# Patient Record
Sex: Female | Born: 1960 | Race: White | Hispanic: No | Marital: Married | Smoking: Current every day smoker
Health system: Southern US, Community
[De-identification: ages and names within clinical notes are randomized; demographics above are authoritative.]

## PROBLEM LIST (undated history)

## (undated) DIAGNOSIS — E119 Type 2 diabetes mellitus without complications: Secondary | ICD-10-CM

## (undated) DIAGNOSIS — I10 Essential (primary) hypertension: Secondary | ICD-10-CM

## (undated) DIAGNOSIS — C801 Malignant (primary) neoplasm, unspecified: Secondary | ICD-10-CM

## (undated) HISTORY — DX: Type 2 diabetes mellitus without complications: E11.9

## (undated) HISTORY — DX: Essential (primary) hypertension: I10

## (undated) HISTORY — PX: TONSILLECTOMY: SUR1361

## (undated) HISTORY — DX: Malignant (primary) neoplasm, unspecified: C80.1

## (undated) HISTORY — PX: TUBAL LIGATION: SHX77

---

## 2002-09-05 ENCOUNTER — Ambulatory Visit (HOSPITAL_BASED_OUTPATIENT_CLINIC_OR_DEPARTMENT_OTHER): Admission: RE | Admit: 2002-09-05 | Discharge: 2002-09-05 | Payer: Self-pay | Admitting: *Deleted

## 2004-05-10 HISTORY — PX: OTHER SURGICAL HISTORY: SHX169

## 2005-05-28 ENCOUNTER — Ambulatory Visit (HOSPITAL_COMMUNITY): Admission: RE | Admit: 2005-05-28 | Discharge: 2005-05-28 | Payer: Self-pay | Admitting: Family Medicine

## 2009-01-17 ENCOUNTER — Ambulatory Visit (HOSPITAL_COMMUNITY): Admission: RE | Admit: 2009-01-17 | Discharge: 2009-01-17 | Payer: Self-pay | Admitting: Family Medicine

## 2010-09-25 NOTE — Op Note (Signed)
NAME:  Leslie May, Leslie May                     ACCOUNT NO.:  [id number]   MEDICAL RECORD NO.:  [id number]                   PATIENT TYPE:  AMB   LOCATION:  DSC                                  FACILITY:  [facility]   PHYSICIAN:  Lowell Bouton, M.D.      DATE OF BIRTH:  07-15-1960   DATE OF PROCEDURE:  09/05/2002  DATE OF DISCHARGE:                                 OPERATIVE REPORT   PREOPERATIVE DIAGNOSIS:  Ulnar nerve compression, left elbow.   POSTOPERATIVE DIAGNOSIS:  Ulnar nerve compression, left elbow.   PROCEDURE:  Anterior transposition subcutaneously of ulnar nerve, left  elbow.   SURGEON:  Shirlee Latch, M.D.   ANESTHESIA:  General.   OPERATIVE FINDINGS:  The patient had a small muscle that extended from the  medial epicondyle posteriorly.  The proximal end of the nerve appeared to be  free and at the entrance to the FCU and there was not significant pressure.  The branch to the FCU was intact.   DESCRIPTION OF PROCEDURE:  Under general anesthesia with a tourniquet on the  left arm, the left arm was prepped and draped in usual fashion.  After  exsanguinating the limb, the tourniquet was inflated to 250 mmHg.  A curved  incision was made following the ulnar nerve at the elbow extending  proximally and distally to the medial epicondyle.  Sharp dissection was  carried through the subcutaneous tissues and bleeding points were  coagulated.  Weitlaner retractors were inserted for retraction.  Blunt  dissection was carried down to the ulnar nerve in the groove and it was  found just proximal to the medial epicondyle.  It was then traced proximally  and distally, and there was a band of muscle with fascia overlying the nerve  just at the medial epicondyle that went posteriorly.  This was divided off  of the medial epicondyle.  This appeared to be the site of compression.  The  nerve was then traced more distally down just past the branch to the FCU  taking care  to protect it.  It was traced proximally about 8.0 cm above the  medial epicondyle.  Fascial bands were released throughout.  The nerve was  freed up circumferentially and bleeding was controlled with electrocautery.  A knife was used to elevate the subcutaneous fat from the muscle of the  flexor origin and the nerve was transposed anteriorly into the subcutaneous  fat.  The fat was then closed back down to the medial epicondyle with a 4-0  Vicryl suture allowing adequate room for the nerve to glide anteriorly.  The  elbow was placed through a range of motion and it appeared not to have any  kinking of the nerve.  Four sutures were inserted in that area and then the  wound was irrigated with saline and the subcutaneous tissue was closed with  4-0 Vicryl over a vessel loop drain.  The skin was closed with a 3-0  subcuticular Prolene.  Steri-Strips were applied, followed by  sterile dressings, and a posterior splint.  The tourniquet was released with  good circulation of the hand.  0.5% Marcaine was placed in the subcutaneous  tissues for pain control.  The patient went to the recovery room awake,  stable and in good condition.                                               Lowell Bouton, M.D.    EMM/MEDQ  D:  09/05/2002  T:  09/05/2002  Job:  [id number]   cc:   Patrica Duel, M.D.  [address]  [place]  [place] [address]  Fax: [phone]

## 2011-08-10 ENCOUNTER — Ambulatory Visit (INDEPENDENT_AMBULATORY_CARE_PROVIDER_SITE_OTHER): Payer: PRIVATE HEALTH INSURANCE | Admitting: Family Medicine

## 2011-08-10 ENCOUNTER — Encounter: Payer: Self-pay | Admitting: Family Medicine

## 2011-08-10 DIAGNOSIS — N950 Postmenopausal bleeding: Secondary | ICD-10-CM | POA: Insufficient documentation

## 2011-08-10 DIAGNOSIS — Z1321 Encounter for screening for nutritional disorder: Secondary | ICD-10-CM

## 2011-08-10 DIAGNOSIS — E669 Obesity, unspecified: Secondary | ICD-10-CM

## 2011-08-10 DIAGNOSIS — Z13220 Encounter for screening for lipoid disorders: Secondary | ICD-10-CM

## 2011-08-10 DIAGNOSIS — I10 Essential (primary) hypertension: Secondary | ICD-10-CM

## 2011-08-10 DIAGNOSIS — F17200 Nicotine dependence, unspecified, uncomplicated: Secondary | ICD-10-CM

## 2011-08-10 DIAGNOSIS — Z720 Tobacco use: Secondary | ICD-10-CM

## 2011-08-10 DIAGNOSIS — N938 Other specified abnormal uterine and vaginal bleeding: Secondary | ICD-10-CM

## 2011-08-10 DIAGNOSIS — N939 Abnormal uterine and vaginal bleeding, unspecified: Secondary | ICD-10-CM

## 2011-08-10 DIAGNOSIS — N926 Irregular menstruation, unspecified: Secondary | ICD-10-CM

## 2011-08-10 DIAGNOSIS — Z130 Encounter for screening for diseases of the blood and blood-forming organs and certain disorders involving the immune mechanism: Secondary | ICD-10-CM

## 2011-08-10 MED ORDER — OLMESARTAN MEDOXOMIL 20 MG PO TABS: ORAL_TABLET | ORAL | Status: DC

## 2011-08-10 MED ORDER — OMEPRAZOLE 20 MG PO CPDR: 20.0000 mg | DELAYED_RELEASE_CAPSULE | Freq: Every day | ORAL | Status: DC

## 2011-08-10 MED ORDER — VERAPAMIL HCL ER 300 MG PO CP24: 1.0000 | ORAL_CAPSULE | Freq: Every day | ORAL | Status: DC

## 2011-08-10 NOTE — Progress Notes (Signed)
  Subjective:    Patient ID: Leslie May, female    DOB: January 01, 1961, 51 y.o.   MRN: [id number]  HPI  Pt here to establish care, previous PCP [facility] Medications and history reviewed   HTN- out of BP meds x 1 day, she had not seen her PCP in some time and is due for labs  Menstrual bleeding- menses stopped at age 5, for 2 years no cycle, then recently had heavy bleeding x 5 days, this has now resolved. Overdue for PAP Smear, has history of cervical cancer and has had cyrosurgery in the past Takes [place] MVI to help with her hot flashes  Due for Mammogram, due for colonoscopy  Obesity- using weight watchers, has lot 32lbs since January, she wants to lose 40 more pounds      Review of Systems  GEN- denies fatigue, fever, weight loss,weakness, recent illness HEENT- denies eye drainage, change in vision, nasal discharge, CVS- denies chest pain, palpitations RESP- denies SOB, cough, wheeze ABD- denies N/V, change in stools, abd pain GU- denies dysuria, hematuria, dribbling, incontinence MSK- denies joint pain, muscle aches, injury Neuro- denies headache, dizziness, syncope, seizure activity      Objective:   Physical Exam GEN- NAD, alert and oriented x3, obese HEENT- PERRL, EOMI, non injected sclera, pink conjunctiva, MMM, oropharynx clear Neck- Supple, no thyromegaly CVS- RRR, no murmur RESP-CTAB EXT- No edema Pulses- Radial, DP- 2+        Assessment & Plan:

## 2011-08-10 NOTE — Assessment & Plan Note (Signed)
Blood pressure medications refilled. 

## 2011-08-10 NOTE — Assessment & Plan Note (Signed)
Pt not ready to quit, wants to wait until she is done with weight watchers, continue to encourage cessation

## 2011-08-10 NOTE — Assessment & Plan Note (Signed)
Obtain labs, pt to schedule PAP Smear, will also need ultrasound set up to evaluate endometrium

## 2011-08-10 NOTE — Patient Instructions (Signed)
Get the bloodwork done fasting- do not eat after midnight Blood pressure medication refilled Keep up the good work on your [employer] watchers Schedule a physical exam for your PAP Smear within the next 6-8 weeks

## 2011-08-10 NOTE — Assessment & Plan Note (Signed)
Continue weight watchers, pt has had very good sucess

## 2011-09-17 ENCOUNTER — Ambulatory Visit (INDEPENDENT_AMBULATORY_CARE_PROVIDER_SITE_OTHER): Payer: PRIVATE HEALTH INSURANCE | Admitting: Family Medicine

## 2011-09-17 ENCOUNTER — Other Ambulatory Visit (HOSPITAL_COMMUNITY)
Admission: RE | Admit: 2011-09-17 | Discharge: 2011-09-17 | Disposition: A | Discharge status: Home / Self Care | Payer: PRIVATE HEALTH INSURANCE | Source: Ambulatory Visit | Attending: Family Medicine | Admitting: Family Medicine

## 2011-09-17 ENCOUNTER — Telehealth: Payer: Self-pay | Admitting: Family Medicine

## 2011-09-17 ENCOUNTER — Encounter: Payer: Self-pay | Admitting: Family Medicine

## 2011-09-17 VITALS — BP 124/72 | HR 94 | Resp 18 | Ht 65.75 in | Wt 195.0 lb

## 2011-09-17 DIAGNOSIS — N938 Other specified abnormal uterine and vaginal bleeding: Secondary | ICD-10-CM

## 2011-09-17 DIAGNOSIS — I10 Essential (primary) hypertension: Secondary | ICD-10-CM

## 2011-09-17 DIAGNOSIS — Z01419 Encounter for gynecological examination (general) (routine) without abnormal findings: Secondary | ICD-10-CM | POA: Insufficient documentation

## 2011-09-17 DIAGNOSIS — D219 Benign neoplasm of connective and other soft tissue, unspecified: Secondary | ICD-10-CM

## 2011-09-17 DIAGNOSIS — Z720 Tobacco use: Secondary | ICD-10-CM

## 2011-09-17 DIAGNOSIS — F17200 Nicotine dependence, unspecified, uncomplicated: Secondary | ICD-10-CM

## 2011-09-17 DIAGNOSIS — D259 Leiomyoma of uterus, unspecified: Secondary | ICD-10-CM

## 2011-09-17 DIAGNOSIS — E669 Obesity, unspecified: Secondary | ICD-10-CM

## 2011-09-17 DIAGNOSIS — N926 Irregular menstruation, unspecified: Secondary | ICD-10-CM

## 2011-09-17 LAB — LIPID PANEL
Cholesterol: 162 mg/dL (ref 0–200)
HDL: 28 mg/dL — ABNORMAL LOW (ref >39)
VLDL: 29 mg/dL (ref 0–40)

## 2011-09-17 LAB — POC HEMOCCULT BLD/STL (OFFICE/1-CARD/DIAGNOSTIC): Fecal Occult Blood, POC: NEGATIVE

## 2011-09-17 LAB — COMPREHENSIVE METABOLIC PANEL
AST: 15 U/L (ref 0–37)
Chloride: 104 mEq/L (ref 96–112)
Potassium: 5.4 mEq/L — ABNORMAL HIGH (ref 3.5–5.3)
Total Bilirubin: 0.4 mg/dL (ref 0.3–1.2)

## 2011-09-17 NOTE — Patient Instructions (Signed)
We will send a letter with your PAP Smear results We will call with lab results next week Continue your current medications Ultrasound will be set up for the bleeding If you want a colonoscopy or Mammogram scheduled please call I recommend eye visit yearly  I recommend dental visit every 6 months Congratulations on the weight loss F/U 4 months for blood pressure

## 2011-09-17 NOTE — Telephone Encounter (Signed)
Patient is aware 

## 2011-09-18 LAB — CBC WITH DIFFERENTIAL/PLATELET
Basophils Relative: 0 % (ref 0–1)
Eosinophils Absolute: 0.1 10*3/uL (ref 0.0–0.7)
Eosinophils Relative: 1 % (ref 0–5)
Hemoglobin: 14.8 g/dL (ref 12.0–15.0)
MCHC: 32.2 g/dL (ref 30.0–36.0)
MCV: 89 fL (ref 78.0–100.0)
Monocytes Relative: 6 % (ref 3–12)
Neutro Abs: 6.1 10*3/uL (ref 1.7–7.7)
Neutrophils Relative %: 58 % (ref 43–77)
RDW: 14.1 % (ref 11.5–15.5)
WBC: 10.6 10*3/uL — ABNORMAL HIGH (ref 4.0–10.5)

## 2011-09-18 LAB — FOLLICLE STIMULATING HORMONE: FSH: 38.7 m[IU]/mL

## 2011-09-19 ENCOUNTER — Encounter: Payer: Self-pay | Admitting: Family Medicine

## 2011-09-19 NOTE — Progress Notes (Signed)
  Subjective:    Patient ID: Leslie May, female    DOB: 08-30-1960, 51 y.o.   MRN: [id number]  HPI Pt here for GYN exam Medications reviewed, doing well on BP medications, She had another episode of vaginal bleeding, LMP 2 years ago. Mild cramping, small clots noted. Declines Mammogram, Declines Colonoscopy Labs done today Review of Systems GEN- denies fatigue, fever, weight loss,weakness, recent illness HEENT- denies eye drainage, change in vision, nasal discharge, CVS- denies chest pain, palpitations RESP- denies SOB, cough, wheeze ABD- denies N/V, change in stools, abd pain GU- denies dysuria, hematuria, dribbling, incontinence MSK- denies joint pain, muscle aches, injury Neuro- denies headache, dizziness, syncope, seizure activity       Objective:   Physical Exam GEN- NAD, alert and oriented x3 Neck- supple, no thyromegaly CVS-RRR,no murmur RESP-CTAB Breast- normal symmetry, no nipple inversion,no nipple drainage, no nodules or lumps felt Nodes- no axillary nodes GU- normal external genitalia, vaginal mucosa pink and moist, cervix visualized no growth, no blood form os,no discharge, no CMT, no ovarian masses, uterus normal size Ext-no edema Pulse- Radial 2+ Rectal-normal tone, FOBT Neg      Assessment & Plan:

## 2011-09-19 NOTE — Assessment & Plan Note (Signed)
This is concerning as she has not had a menses in greater than 2 years. PAP obtained, labs pending, will obtain ultrasound. Discussed need for GYN referral

## 2011-09-19 NOTE — Assessment & Plan Note (Signed)
Continued weight loss with Weight watchers

## 2011-09-19 NOTE — Assessment & Plan Note (Signed)
Continue to encourage cessation, pt concentrating of weight loss at this time

## 2011-09-19 NOTE — Assessment & Plan Note (Signed)
At goal with current meds 

## 2011-09-19 NOTE — Assessment & Plan Note (Signed)
PAP smear done, declines preventative screening for breast and colon

## 2011-09-21 ENCOUNTER — Ambulatory Visit (HOSPITAL_COMMUNITY)
Admission: RE | Admit: 2011-09-21 | Discharge: 2011-09-21 | Disposition: A | Discharge status: Home / Self Care | Payer: PRIVATE HEALTH INSURANCE | Source: Ambulatory Visit | Attending: Family Medicine | Admitting: Family Medicine

## 2011-09-21 ENCOUNTER — Other Ambulatory Visit (HOSPITAL_COMMUNITY): Payer: PRIVATE HEALTH INSURANCE

## 2011-09-21 ENCOUNTER — Other Ambulatory Visit: Payer: Self-pay | Admitting: Family Medicine

## 2011-09-21 DIAGNOSIS — N938 Other specified abnormal uterine and vaginal bleeding: Secondary | ICD-10-CM

## 2011-09-21 DIAGNOSIS — R9389 Abnormal findings on diagnostic imaging of other specified body structures: Secondary | ICD-10-CM | POA: Insufficient documentation

## 2011-09-21 DIAGNOSIS — N950 Postmenopausal bleeding: Secondary | ICD-10-CM | POA: Insufficient documentation

## 2011-09-21 DIAGNOSIS — N72 Inflammatory disease of cervix uteri: Secondary | ICD-10-CM | POA: Insufficient documentation

## 2011-09-22 ENCOUNTER — Telehealth: Payer: Self-pay | Admitting: Family Medicine

## 2011-09-22 NOTE — Telephone Encounter (Signed)
LVM, please give results Hormones in menapause state, ultrasound shows fibroids these could be causing her bleeding, I have referred her to a female provider in [place], we will call back with appt time Cholesterol looks good with exeception of low HDL, pt working on diet and exercise with weight watches

## 2011-09-22 NOTE — Progress Notes (Signed)
Addended by: Milinda Antis F on: 09/22/2011 08:23 AM   Modules accepted: Orders

## 2011-09-22 NOTE — Telephone Encounter (Signed)
Pt aware and requested info be faxed to her.

## 2011-11-30 ENCOUNTER — Telehealth: Payer: Self-pay | Admitting: Family Medicine

## 2011-12-01 NOTE — Telephone Encounter (Signed)
She said she is not going to do that right now

## 2011-12-01 NOTE — Telephone Encounter (Signed)
Please let her know, she needs to see GYN for this. I have referred her to [facility] before for her bleeding

## 2011-12-02 NOTE — Telephone Encounter (Signed)
Noted, I will not prescribe hormone therapy.

## 2012-01-21 ENCOUNTER — Encounter: Payer: Self-pay | Admitting: Family Medicine

## 2012-01-21 ENCOUNTER — Ambulatory Visit (INDEPENDENT_AMBULATORY_CARE_PROVIDER_SITE_OTHER): Payer: PRIVATE HEALTH INSURANCE | Admitting: Family Medicine

## 2012-01-21 VITALS — BP 120/76 | HR 68 | Resp 18 | Ht 65.75 in | Wt 179.0 lb

## 2012-01-21 DIAGNOSIS — N950 Postmenopausal bleeding: Secondary | ICD-10-CM

## 2012-01-21 DIAGNOSIS — I10 Essential (primary) hypertension: Secondary | ICD-10-CM

## 2012-01-21 DIAGNOSIS — F4321 Adjustment disorder with depressed mood: Secondary | ICD-10-CM

## 2012-01-21 DIAGNOSIS — F4320 Adjustment disorder, unspecified: Secondary | ICD-10-CM

## 2012-01-21 DIAGNOSIS — E669 Obesity, unspecified: Secondary | ICD-10-CM

## 2012-01-21 MED ORDER — OLMESARTAN MEDOXOMIL 20 MG PO TABS: ORAL_TABLET | ORAL | Status: DC

## 2012-01-21 MED ORDER — VERAPAMIL HCL ER 300 MG PO CP24: 1.0000 | ORAL_CAPSULE | Freq: Every day | ORAL | Status: DC

## 2012-01-21 MED ORDER — OMEPRAZOLE 20 MG PO CPDR: 20.0000 mg | DELAYED_RELEASE_CAPSULE | Freq: Every day | ORAL | Status: DC

## 2012-01-21 MED ORDER — CLONAZEPAM 0.5 MG PO TABS: 0.5000 mg | ORAL_TABLET | Freq: Two times a day (BID) | ORAL | Status: DC | PRN

## 2012-01-21 NOTE — Patient Instructions (Signed)
Use the nerve pill as needed Call if you want to do FMLA Continue all other medications When you are ready for GYN  F/U 4 Months

## 2012-01-23 ENCOUNTER — Encounter: Payer: Self-pay | Admitting: Family Medicine

## 2012-01-23 DIAGNOSIS — F4321 Adjustment disorder with depressed mood: Secondary | ICD-10-CM | POA: Insufficient documentation

## 2012-01-23 NOTE — Assessment & Plan Note (Signed)
Recent passing of father, advised her that her many symptoms are normal during this period and worse because of stress with her sister and lack of sleep. She has fear of medication but agreed to low dose benzo.

## 2012-01-23 NOTE — Assessment & Plan Note (Signed)
Well controlled 

## 2012-01-23 NOTE — Progress Notes (Signed)
  Subjective:    Patient ID: Leslie May, female    DOB: 08/18/1960, 51 y.o.   MRN: [id number]  HPI Pt presents for chronic medical follow-up. No difficulties with blood pressure medication. Her father died 1 week ago and she is having a feud with her sister over the estate and now it has become legal. She is not sleeping, and feels her hot flashes are worse, she is very anxious and can not relax. She has not taken off work because of fear of putting too much on others. She did not go to GYN appt for bleeding. Asking for hormone pills   Review of Systems  GEN- denies fatigue, fever, weight loss,weakness, recent illness HEENT- denies eye drainage, change in vision, nasal discharge, CVS- denies chest pain, palpitations RESP- denies SOB, cough, wheeze ABD- denies N/V, change in stools, abd pain GU- denies dysuria, hematuria, dribbling, incontinence MSK- denies joint pain, muscle aches, injury Neuro- denies headache, dizziness, syncope, seizure activity      Objective:   Physical Exam GEN- NAD, alert and oriented x3 HEENT- PERRL, EOMI, non injected sclera, pink conjunctiva, MMM, oropharynx clear CVS- RRR, no murmur RESP-CTAB EXT- No edema Pulses- Radial, DP- 2+ Psych- crying during exam, not overly anxious, +depressed affect, no SI       Assessment & Plan:

## 2012-01-23 NOTE — Assessment & Plan Note (Signed)
No further bleeding, advised her symptoms of menopause worse in setting of grief. She would need to be evaluated by GYN for hormone therapy and she wishes to wait, advised too many risk with her bleeding not being evaluated yet

## 2012-01-23 NOTE — Assessment & Plan Note (Signed)
Continues to loose weight with weight watches

## 2012-01-24 ENCOUNTER — Telehealth: Payer: Self-pay | Admitting: Family Medicine

## 2012-01-24 MED ORDER — DIPHENOXYLATE-ATROPINE 2.5-0.025 MG PO TABS: 1.0000 | ORAL_TABLET | Freq: Four times a day (QID) | ORAL | Status: DC | PRN

## 2012-01-24 NOTE — Telephone Encounter (Signed)
Pt came by and was triaged by nurse, she failed to mention she has had some diarrhea on and off for past 2 weeks, tried immodium which is not helping, seen on Friday, benign exam Will give trial of lomotil

## 2012-01-27 ENCOUNTER — Telehealth: Payer: Self-pay | Admitting: Family Medicine

## 2012-01-27 NOTE — Telephone Encounter (Signed)
FMLA papers at the front

## 2012-01-28 NOTE — Telephone Encounter (Signed)
Patient has picked up the paperwork

## 2012-02-04 ENCOUNTER — Telehealth: Payer: Self-pay | Admitting: Family Medicine

## 2012-02-04 NOTE — Telephone Encounter (Signed)
Pt can in, discussed she may not have qualified for disability based on being out for grievance and symptoms related, but she was concerned she would only get 2 weeks of FMLA and this should not be the case. I called [employer] during the visit with Mrs. Behunin but message was left, will try back latera [phone]/ [phone]

## 2012-02-17 ENCOUNTER — Ambulatory Visit (INDEPENDENT_AMBULATORY_CARE_PROVIDER_SITE_OTHER): Payer: PRIVATE HEALTH INSURANCE | Admitting: Family Medicine

## 2012-02-17 ENCOUNTER — Encounter: Payer: Self-pay | Admitting: Family Medicine

## 2012-02-17 VITALS — BP 106/62 | HR 86 | Resp 18 | Ht 65.75 in | Wt 180.0 lb

## 2012-02-17 DIAGNOSIS — F4320 Adjustment disorder, unspecified: Secondary | ICD-10-CM

## 2012-02-17 DIAGNOSIS — F4321 Adjustment disorder with depressed mood: Secondary | ICD-10-CM

## 2012-02-17 DIAGNOSIS — I10 Essential (primary) hypertension: Secondary | ICD-10-CM

## 2012-02-17 DIAGNOSIS — N950 Postmenopausal bleeding: Secondary | ICD-10-CM

## 2012-02-17 MED ORDER — OMEPRAZOLE 20 MG PO CPDR: 20.0000 mg | DELAYED_RELEASE_CAPSULE | Freq: Every day | ORAL | Status: DC

## 2012-02-17 NOTE — Patient Instructions (Signed)
Continue current medications  Released back to Work F/U 4 months

## 2012-02-17 NOTE — Assessment & Plan Note (Signed)
She's taken some time for her family and degrees. She is ready to return to work. She will be released back to work full-time with no restrictions

## 2012-02-17 NOTE — Progress Notes (Signed)
  Subjective:    Patient ID: Leslie May, female    DOB: February 20, 1961, 51 y.o.   MRN: [id number]  HPI Patient presents to followup previous FMLA for grievance from her fathers recent death. She's been doing much better. She rarely uses the Klonopin. She is ready to return to work. Her son is separated from his wife and this is causing her some stress but overall she feels better. She's been seen by GYN for the dysfunctional uterine bleeding and plans to have endometrial biopsy tomorrow as well as mammogram. She was started on CombiPatch hormone therapy for the hot flashes.   Review of Systems  GEN- denies fatigue, fever, weight loss,weakness, recent illness HEENT- denies eye drainage, change in vision, nasal discharge, CVS- denies chest pain, palpitations RESP- denies SOB, cough, wheeze ABD- denies N/V, change in stools, abd pain GU- denies dysuria, hematuria, dribbling, incontinence, +hot flashes MSK- denies joint pain, muscle aches, injury Neuro- denies headache, dizziness, syncope, seizure activity      Objective:   Physical Exam GEN- NAD, alert and oriented x3 HEENT- PERRL, EOMI, non injected sclera, pink conjunctiva, MMM, oropharynx clear Neck- Supple,  CVS- RRR, no murmur RESP-CTAB EXT- No edema Pulses- Radial, DP- 2+ Psych-normal affect and Mood         Assessment & Plan:

## 2012-02-17 NOTE — Assessment & Plan Note (Signed)
Well controlled 

## 2012-02-17 NOTE — Assessment & Plan Note (Signed)
She has been seen by GYN and in the process of getting work-up, advised her hormone therapy will take time to work, she was under the impression this was not helping however just started combipatch

## 2012-02-18 ENCOUNTER — Other Ambulatory Visit: Payer: Self-pay | Admitting: Obstetrics and Gynecology

## 2012-02-18 ENCOUNTER — Ambulatory Visit: Payer: PRIVATE HEALTH INSURANCE | Admitting: Family Medicine

## 2012-06-19 ENCOUNTER — Ambulatory Visit: Payer: PRIVATE HEALTH INSURANCE | Admitting: Family Medicine

## 2012-07-06 ENCOUNTER — Ambulatory Visit (INDEPENDENT_AMBULATORY_CARE_PROVIDER_SITE_OTHER): Payer: BC Managed Care – PPO | Admitting: Family Medicine

## 2012-07-06 ENCOUNTER — Encounter: Payer: Self-pay | Admitting: Family Medicine

## 2012-07-06 VITALS — BP 122/74 | HR 68 | Resp 18 | Ht 65.75 in | Wt 182.1 lb

## 2012-07-06 DIAGNOSIS — E669 Obesity, unspecified: Secondary | ICD-10-CM

## 2012-07-06 DIAGNOSIS — I10 Essential (primary) hypertension: Secondary | ICD-10-CM

## 2012-07-06 DIAGNOSIS — Z720 Tobacco use: Secondary | ICD-10-CM

## 2012-07-06 DIAGNOSIS — F17200 Nicotine dependence, unspecified, uncomplicated: Secondary | ICD-10-CM

## 2012-07-06 NOTE — Progress Notes (Signed)
  Subjective:    Patient ID: Leslie May, female    DOB: Dec 17, 1960, 52 y.o.   MRN: [id number]  HPI  Patient here to followup high blood pressure. She has no specific concerns she is doing well. She did gain some weight but is back on her exercise routine as well as Weight Watchers. She does continue to smoke.  Review of Systems   GEN- denies fatigue, fever, weight loss,weakness, recent illness HEENT- denies eye drainage, change in vision, nasal discharge, CVS- denies chest pain, palpitations RESP- denies SOB, cough, wheeze ABD- denies N/V, change in stools, abd pain GU- denies dysuria, hematuria, dribbling, incontinence MSK- denies joint pain, muscle aches, injury Neuro- denies headache, dizziness, syncope, seizure activity      Objective:   Physical Exam GEN- NAD, alert and oriented x3 HEENT- PERRL, EOMI, non injected sclera, pink conjunctiva, MMM, oropharynx clear CVS- RRR, no murmur RESP-CTAB EXT- No edema Pulses- Radial, DP- 2+        Assessment & Plan:

## 2012-07-06 NOTE — Patient Instructions (Signed)
Stop the benicar  Continue verapamil You can give yourself a trial off the prilosec  Get the labs done 1 week before next visit F/U Mid May- for blood pressure

## 2012-07-07 NOTE — Assessment & Plan Note (Signed)
Unchanged, counseled on cessation 

## 2012-07-07 NOTE — Assessment & Plan Note (Signed)
Her blood pressure has been very well controlled.I will stop her Benicar and leave her on verapamil to make sure she maintains her blood pressure and she has been losing weight

## 2012-07-07 NOTE — Assessment & Plan Note (Signed)
Continues on weight loss plan

## 2012-08-10 ENCOUNTER — Ambulatory Visit (INDEPENDENT_AMBULATORY_CARE_PROVIDER_SITE_OTHER): Payer: BC Managed Care – PPO | Admitting: Family Medicine

## 2012-08-10 ENCOUNTER — Encounter: Payer: Self-pay | Admitting: Family Medicine

## 2012-08-10 VITALS — BP 124/80 | HR 77 | Temp 98.3°F | Resp 16 | Ht 65.75 in | Wt 188.0 lb

## 2012-08-10 DIAGNOSIS — J069 Acute upper respiratory infection, unspecified: Secondary | ICD-10-CM

## 2012-08-10 NOTE — Patient Instructions (Addendum)
Sudafed as directed for 2 days  Robitussin for cough Mucinex  Plenty of fluids Call if you do not improve  Upper Respiratory Infection, Adult An upper respiratory infection (URI) is also sometimes known as the common cold. The upper respiratory tract includes the nose, sinuses, throat, trachea, and bronchi. Bronchi are the airways leading to the lungs. Most people improve within 1 week, but symptoms can last up to 2 weeks. A residual cough may last even longer.  CAUSES Many different viruses can infect the tissues lining the upper respiratory tract. The tissues become irritated and inflamed and often become very moist. Mucus production is also common. A cold is contagious. You can easily spread the virus to others by oral contact. This includes kissing, sharing a glass, coughing, or sneezing. Touching your mouth or nose and then touching a surface, which is then touched by another person, can also spread the virus. SYMPTOMS  Symptoms typically develop 1 to 3 days after you come in contact with a cold virus. Symptoms vary from person to person. They may include:  Runny nose.  Sneezing.  Nasal congestion.  Sinus irritation.  Sore throat.  Loss of voice (laryngitis).  Cough.  Fatigue.  Muscle aches.  Loss of appetite.  Headache.  Low-grade fever. DIAGNOSIS  You might diagnose your own cold based on familiar symptoms, since most people get a cold 2 to 3 times a year. Your caregiver can confirm this based on your exam. Most importantly, your caregiver can check that your symptoms are not due to another disease such as strep throat, sinusitis, pneumonia, asthma, or epiglottitis. Blood tests, throat tests, and X-rays are not necessary to diagnose a common cold, but they may sometimes be helpful in excluding other more serious diseases. Your caregiver will decide if any further tests are required. RISKS AND COMPLICATIONS  You may be at risk for a more severe case of the common cold if  you smoke cigarettes, have chronic heart disease (such as heart failure) or lung disease (such as asthma), or if you have a weakened immune system. The very young and very old are also at risk for more serious infections. Bacterial sinusitis, middle ear infections, and bacterial pneumonia can complicate the common cold. The common cold can worsen asthma and chronic obstructive pulmonary disease (COPD). Sometimes, these complications can require emergency medical care and may be life-threatening. PREVENTION  The best way to protect against getting a cold is to practice good hygiene. Avoid oral or hand contact with people with cold symptoms. Wash your hands often if contact occurs. There is no clear evidence that vitamin C, vitamin E, echinacea, or exercise reduces the chance of developing a cold. However, it is always recommended to get plenty of rest and practice good nutrition. TREATMENT  Treatment is directed at relieving symptoms. There is no cure. Antibiotics are not effective, because the infection is caused by a virus, not by bacteria. Treatment may include:  Increased fluid intake. Sports drinks offer valuable electrolytes, sugars, and fluids.  Breathing heated mist or steam (vaporizer or shower).  Eating chicken soup or other clear broths, and maintaining good nutrition.  Getting plenty of rest.  Using gargles or lozenges for comfort.  Controlling fevers with ibuprofen or acetaminophen as directed by your caregiver.  Increasing usage of your inhaler if you have asthma. Zinc gel and zinc lozenges, taken in the first 24 hours of the common cold, can shorten the duration and lessen the severity of symptoms. Pain medicines may help  with fever, muscle aches, and throat pain. A variety of non-prescription medicines are available to treat congestion and runny nose. Your caregiver can make recommendations and may suggest nasal or lung inhalers for other symptoms.  HOME CARE INSTRUCTIONS   Only  take over-the-counter or prescription medicines for pain, discomfort, or fever as directed by your caregiver.  Use a warm mist humidifier or inhale steam from a shower to increase air moisture. This may keep secretions moist and make it easier to breathe.  Drink enough water and fluids to keep your urine clear or pale yellow.  Rest as needed.  Return to work when your temperature has returned to normal or as your caregiver advises. You may need to stay home longer to avoid infecting others. You can also use a face mask and careful hand washing to prevent spread of the virus. SEEK MEDICAL CARE IF:   After the first few days, you feel you are getting worse rather than better.  You need your caregiver's advice about medicines to control symptoms.  You develop chills, worsening shortness of breath, or brown or red sputum. These may be signs of pneumonia.  You develop yellow or brown nasal discharge or pain in the face, especially when you bend forward. These may be signs of sinusitis.  You develop a fever, swollen neck glands, pain with swallowing, or Tat areas in the back of your throat. These may be signs of strep throat. SEEK IMMEDIATE MEDICAL CARE IF:   You have a fever.  You develop severe or persistent headache, ear pain, sinus pain, or chest pain.  You develop wheezing, a prolonged cough, cough up blood, or have a change in your usual mucus (if you have chronic lung disease).  You develop sore muscles or a stiff neck. Document Released: 10/20/2000 Document Revised: 07/19/2011 Document Reviewed: 08/28/2010 [facility] Patient Information 2013 [place], [place].

## 2012-08-10 NOTE — Progress Notes (Signed)
  Subjective:    Patient ID: Leslie May, female    DOB: 1960-10-10, 52 y.o.   MRN: [id number]  HPI   Cough and nasal congestion for the past 24 hours cough is nonproductive she feels her ears popping positive sick contact with her husband. Fever of 100F last night.. denies nausea vomiting diarrhea. No over-the-counter medications taken   Review of Systems  GEN- denies fatigue,+ fever, weight loss,weakness, recent illness HEENT- denies eye drainage, change in vision, +nasal discharge, CVS- denies chest pain, palpitations RESP- denies SOB, +cough, wheeze Neuro- denies headache, dizziness, syncope, seizure activity      Objective:   Physical Exam GEN- NAD, alert and oriented x3 HEENT- PERRL, EOMI, non injected sclera, pink conjunctiva, MMM, oropharynx mild injection, TM clear bilat no effusion,  + maxillary sinus tenderness, inflammed turbinates,  Nasal drainage  Neck- Supple, no LAD CVS- RRR, no murmur RESP-CTAB EXT- No edema Pulses- Radial 2+         Assessment & Plan:

## 2012-08-11 DIAGNOSIS — J069 Acute upper respiratory infection, unspecified: Secondary | ICD-10-CM | POA: Insufficient documentation

## 2012-08-11 NOTE — Assessment & Plan Note (Signed)
Will treat his viral URI based on 24 hours for the symptoms. Symptomatic treatment with Mucinex DM as well as Sudafed for 2 days for the nasal congestion

## 2012-08-16 ENCOUNTER — Telehealth: Payer: Self-pay | Admitting: Family Medicine

## 2012-08-16 MED ORDER — VERAPAMIL HCL ER 300 MG PO CP24: 1.0000 | ORAL_CAPSULE | Freq: Every day | ORAL | Status: DC

## 2012-08-16 NOTE — Telephone Encounter (Signed)
Sent in

## 2012-09-08 ENCOUNTER — Telehealth: Payer: Self-pay | Admitting: Family Medicine

## 2012-09-08 MED ORDER — OMEPRAZOLE 20 MG PO CPDR: 20.0000 mg | DELAYED_RELEASE_CAPSULE | Freq: Every day | ORAL | Status: DC

## 2012-09-08 NOTE — Telephone Encounter (Signed)
Sent in

## 2012-09-19 ENCOUNTER — Telehealth: Payer: Self-pay | Admitting: Family Medicine

## 2012-09-19 LAB — CBC
HCT: 46 % (ref 36.0–46.0)
Hemoglobin: 15.9 g/dL — ABNORMAL HIGH (ref 12.0–15.0)
MCHC: 34.6 g/dL (ref 30.0–36.0)
MCV: 85 fL (ref 78.0–100.0)
RBC: 5.41 MIL/uL — ABNORMAL HIGH (ref 3.87–5.11)
RDW: 13.3 % (ref 11.5–15.5)

## 2012-09-19 LAB — COMPREHENSIVE METABOLIC PANEL
ALT: 11 U/L (ref 0–35)
Albumin: 4.4 g/dL (ref 3.5–5.2)
Total Protein: 7 g/dL (ref 6.0–8.3)

## 2012-09-19 LAB — LIPID PANEL
LDL Cholesterol: 100 mg/dL — ABNORMAL HIGH (ref 0–99)
Total CHOL/HDL Ratio: 4.7 Ratio
Triglycerides: 138 mg/dL (ref <150)
VLDL: 28 mg/dL (ref 0–40)

## 2012-09-19 NOTE — Telephone Encounter (Signed)
Called again.......

## 2012-09-19 NOTE — Telephone Encounter (Signed)
Call pt and see what she is referring too She has already had FSH,LH done last year showing post menopausal so I  Would not repeat, she is seeing GYN if she is requesting these she should call her GYN to see what she would recommend being checked

## 2012-09-22 ENCOUNTER — Ambulatory Visit (INDEPENDENT_AMBULATORY_CARE_PROVIDER_SITE_OTHER): Payer: BC Managed Care – PPO | Admitting: Family Medicine

## 2012-09-22 ENCOUNTER — Encounter: Payer: Self-pay | Admitting: Family Medicine

## 2012-09-22 VITALS — BP 130/82 | HR 66 | Resp 16 | Wt 189.0 lb

## 2012-09-22 DIAGNOSIS — I10 Essential (primary) hypertension: Secondary | ICD-10-CM

## 2012-09-22 DIAGNOSIS — F17200 Nicotine dependence, unspecified, uncomplicated: Secondary | ICD-10-CM

## 2012-09-22 DIAGNOSIS — Z720 Tobacco use: Secondary | ICD-10-CM

## 2012-09-22 DIAGNOSIS — E669 Obesity, unspecified: Secondary | ICD-10-CM

## 2012-09-22 NOTE — Assessment & Plan Note (Signed)
Continues to work on [employer]

## 2012-09-22 NOTE — Patient Instructions (Addendum)
Short term goal set for weight Continue current medications Labs to be faxed to Dr. Renaldo Fiddler F/U 4 months at [facility]

## 2012-09-22 NOTE — Progress Notes (Signed)
  Subjective:    Patient ID: Leslie May, female    DOB: 01-22-1961, 52 y.o.   MRN: [id number]  HPI  Patient here to follow chronic medical problems. She has no specific concerns. She is followed by GYN secondary to postmenopausal bleeding she is currently on estrogen patch which was lowered recently because she continued to have breakthrough bleeding. Fasting labs reviewed with patient.  Review of Systems  GEN- denies fatigue, fever, weight loss,weakness, recent illness HEENT- denies eye drainage, change in vision, nasal discharge, CVS- denies chest pain, palpitations RESP- denies SOB, cough, wheeze ABD- denies N/V, change in stools, abd pain GU- denies dysuria, hematuria, dribbling, incontinence MSK- denies joint pain, muscle aches, injury Neuro- denies headache, dizziness, syncope, seizure activity      Objective:   Physical Exam GEN- NAD, alert and oriented x3 HEENT- PERRL, EOMI, non injected sclera, pink conjunctiva, MMM, oropharynx clear CVS- RRR, no murmur RESP-CTAB EXT- No edema Pulses- Radial, DP- 2+        Assessment & Plan:

## 2012-09-22 NOTE — Assessment & Plan Note (Signed)
Well-controlled on 1 medication 

## 2012-09-22 NOTE — Telephone Encounter (Signed)
Patient in for ov today  

## 2012-09-22 NOTE — Assessment & Plan Note (Signed)
Counseled on importance of cessation 

## 2013-01-24 ENCOUNTER — Telehealth: Payer: Self-pay | Admitting: Family Medicine

## 2013-01-24 MED ORDER — VERAPAMIL HCL ER 300 MG PO CP24: 1.0000 | ORAL_CAPSULE | Freq: Every day | ORAL | Status: DC

## 2013-01-24 MED ORDER — OMEPRAZOLE 20 MG PO CPDR: 20.0000 mg | DELAYED_RELEASE_CAPSULE | Freq: Every day | ORAL | Status: DC

## 2013-01-24 NOTE — Telephone Encounter (Signed)
Omeprazole DR 20 mg cap 1 QD #90 Verapamil ER PM 300 mg cap 1 QD #90

## 2013-01-24 NOTE — Telephone Encounter (Signed)
Meds refilled.

## 2013-03-02 ENCOUNTER — Telehealth: Payer: Self-pay | Admitting: Family Medicine

## 2013-03-02 NOTE — Telephone Encounter (Signed)
LMTRC

## 2013-03-02 NOTE — Telephone Encounter (Signed)
Having some BP issues and she has questions about Benicar. Please call

## 2013-03-02 NOTE — Telephone Encounter (Signed)
Pt called back was concerned about her BP being elevated d/t taking sudafed, she is taking benicar as instructed by Dr. Jeanice Lim, she is going to monitor her BP for a week and call us with the readings.

## 2013-03-08 ENCOUNTER — Telehealth: Payer: Self-pay | Admitting: Family Medicine

## 2013-03-08 NOTE — Telephone Encounter (Signed)
Pt called stating she has started taking Benicar because her BP has been up and since starting to take it her BPs have been reading pretty good. 123/72, 117/71,140/83 for the last couple of days, she has been taking Benicar for a week, she feels Benicar is working. She also needs a refill on omeprazole.

## 2013-03-08 NOTE — Telephone Encounter (Signed)
This is a duplicate call.  

## 2013-03-08 NOTE — Telephone Encounter (Signed)
Please have her schedule an appointment, she is overdue anyway Her benicar was discontinued in Feb and she was left on diltazem??  Her previous dose of Benicar was 20mg , please call and verify if she is indeed taking If so, give 30 day fill, needs to come in.

## 2013-03-08 NOTE — Telephone Encounter (Signed)
Patient needs refills on her Benicar, Pilosec

## 2013-03-09 ENCOUNTER — Telehealth: Payer: Self-pay | Admitting: Family Medicine

## 2013-03-09 MED ORDER — OLMESARTAN MEDOXOMIL-HCTZ 20-12.5 MG PO TABS: 1.0000 | ORAL_TABLET | Freq: Every day | ORAL | Status: DC

## 2013-03-09 NOTE — Telephone Encounter (Signed)
Med refilled.

## 2013-03-09 NOTE — Telephone Encounter (Signed)
Left message with pt to return my call °

## 2013-03-09 NOTE — Telephone Encounter (Signed)
Patient called back . [phone]

## 2013-04-09 ENCOUNTER — Telehealth: Payer: Self-pay | Admitting: Family Medicine

## 2013-04-09 MED ORDER — OLMESARTAN MEDOXOMIL-HCTZ 20-12.5 MG PO TABS: 1.0000 | ORAL_TABLET | Freq: Every day | ORAL | Status: DC

## 2013-04-09 NOTE — Telephone Encounter (Signed)
Call back numbers [phone](home) [phone](cell) Pt is needing a refill on her Benicar needing a 3 month supply so that Insurance will pay for it and she would like a generic form if it is better Pharmacy is [facility]

## 2013-04-09 NOTE — Telephone Encounter (Signed)
done

## 2013-07-27 ENCOUNTER — Other Ambulatory Visit: Payer: Self-pay | Admitting: *Deleted

## 2013-07-27 MED ORDER — VERAPAMIL HCL ER 300 MG PO CP24: 1.0000 | ORAL_CAPSULE | Freq: Every day | ORAL | Status: DC

## 2013-07-27 NOTE — Telephone Encounter (Signed)
Refill appropriate and filled per protocol. 

## 2013-08-17 ENCOUNTER — Encounter (HOSPITAL_COMMUNITY): Payer: Self-pay | Admitting: Pharmacist

## 2013-08-24 ENCOUNTER — Encounter (HOSPITAL_COMMUNITY): Payer: Self-pay

## 2013-08-24 ENCOUNTER — Encounter (HOSPITAL_COMMUNITY)
Admission: RE | Admit: 2013-08-24 | Discharge: 2013-08-24 | Disposition: A | Discharge status: Home / Self Care | Payer: BC Managed Care – PPO | Source: Ambulatory Visit | Attending: Obstetrics and Gynecology | Admitting: Obstetrics and Gynecology

## 2013-08-24 DIAGNOSIS — Z01810 Encounter for preprocedural cardiovascular examination: Secondary | ICD-10-CM | POA: Insufficient documentation

## 2013-08-24 DIAGNOSIS — Z01812 Encounter for preprocedural laboratory examination: Secondary | ICD-10-CM | POA: Insufficient documentation

## 2013-08-24 LAB — BASIC METABOLIC PANEL
BUN: 11 mg/dL (ref 6–23)
CO2: 29 meq/L (ref 19–32)
Calcium: 9.2 mg/dL (ref 8.4–10.5)
Chloride: 101 mEq/L (ref 96–112)
Creatinine, Ser: 0.69 mg/dL (ref 0.50–1.10)
GFR calc Af Amer: >90 mL/min (ref >90)
GFR calc non Af Amer: >90 mL/min (ref >90)
GLUCOSE: 101 mg/dL — AB (ref 70–99)
POTASSIUM: 3.5 meq/L — AB (ref 3.7–5.3)
Sodium: 142 mEq/L (ref 137–147)

## 2013-08-24 LAB — CBC
HCT: 47.7 % — ABNORMAL HIGH (ref 36.0–46.0)
HEMOGLOBIN: 16.5 g/dL — AB (ref 12.0–15.0)
MCH: 30.2 pg (ref 26.0–34.0)
MCHC: 34.6 g/dL (ref 30.0–36.0)
MCV: 87.2 fL (ref 78.0–100.0)
Platelets: 237 10*3/uL (ref 150–400)
RBC: 5.47 MIL/uL — AB (ref 3.87–5.11)
RDW: 12.8 % (ref 11.5–15.5)
WBC: 8.9 10*3/uL (ref 4.0–10.5)

## 2013-08-24 NOTE — H&P (Addendum)
53yo G2P2 with PMB and endometrial polyp presents for surgical evaluation and management  PMHx:  HTN, GERD PSHx:  BTL, SVD x 2 All:  PCN SHx:  + tobacco, neg ivdu Meds:  Verapamil ER, omeprazole, ASA, combipatch  AF, VSS Gen - NAD CV - RRR Lungs - clear Abd - soft, NT Ext - NT PV - uterus mobile, NT  [place]:  Pelvic ultrasound was performed showing some mild adenomyosis, normal ovaries bilaterally.  After saline infusion, an 8 mm pedunculated polypoid mass was noted in the endometrial cavity.  A/P:  PMB:   Hysteroscopy, D&C, removal of endometrial polyp R/b/a discussed, questions answered, informed consent

## 2013-08-24 NOTE — Patient Instructions (Signed)
[place]  08/24/2013   Your procedure is scheduled on:  08/31/13  Enter through the Main Entrance of [facility] at [place] up the phone at the desk and dial [phone].   Call this number if you have problems the morning of surgery: [phone]   Remember:   Do not eat food:After Midnight.  Do not drink clear liquids: After Midnight.  Take these medicines the morning of surgery with A SIP OF WATER: Prilosec   Do not wear jewelry, make-up or nail polish.  Do not wear lotions, powders, or perfumes. You may wear deodorant.  Do not shave 48 hours prior to surgery.  Do not bring valuables to the hospital.  [facility] is not   responsible for any belongings or valuables brought to the hospital.  Contacts, dentures or bridgework may not be worn into surgery.  Leave suitcase in the car. After surgery it may be brought to your room.  For patients admitted to the hospital, checkout time is 11:00 AM the day of              discharge.   Patients discharged the day of surgery will not be allowed to drive             home.  Name and phone number of your driver: husband  Delrae Alfred  Special Instructions:      Please read over the following fact sheets that you were given:   Surgical Site Infection Prevention

## 2013-08-31 ENCOUNTER — Ambulatory Visit (HOSPITAL_COMMUNITY): Payer: BC Managed Care – PPO | Admitting: Anesthesiology

## 2013-08-31 ENCOUNTER — Encounter (HOSPITAL_COMMUNITY)
Admission: RE | Disposition: A | Discharge status: Home / Self Care | Payer: Self-pay | Source: Ambulatory Visit | Attending: Obstetrics and Gynecology

## 2013-08-31 ENCOUNTER — Ambulatory Visit (HOSPITAL_COMMUNITY)
Admission: RE | Admit: 2013-08-31 | Discharge: 2013-08-31 | Disposition: A | Discharge status: Home / Self Care | Payer: BC Managed Care – PPO | Source: Ambulatory Visit | Attending: Obstetrics and Gynecology | Admitting: Obstetrics and Gynecology

## 2013-08-31 ENCOUNTER — Encounter (HOSPITAL_COMMUNITY): Payer: BC Managed Care – PPO | Admitting: Anesthesiology

## 2013-08-31 ENCOUNTER — Encounter (HOSPITAL_COMMUNITY): Payer: Self-pay | Admitting: Certified Registered"

## 2013-08-31 DIAGNOSIS — G4730 Sleep apnea, unspecified: Secondary | ICD-10-CM | POA: Insufficient documentation

## 2013-08-31 DIAGNOSIS — K219 Gastro-esophageal reflux disease without esophagitis: Secondary | ICD-10-CM | POA: Insufficient documentation

## 2013-08-31 DIAGNOSIS — N840 Polyp of corpus uteri: Secondary | ICD-10-CM | POA: Insufficient documentation

## 2013-08-31 DIAGNOSIS — I10 Essential (primary) hypertension: Secondary | ICD-10-CM | POA: Insufficient documentation

## 2013-08-31 DIAGNOSIS — F17200 Nicotine dependence, unspecified, uncomplicated: Secondary | ICD-10-CM | POA: Insufficient documentation

## 2013-08-31 DIAGNOSIS — Z6835 Body mass index (BMI) 35.0-35.9, adult: Secondary | ICD-10-CM | POA: Insufficient documentation

## 2013-08-31 DIAGNOSIS — N8000 Endometriosis of the uterus, unspecified: Secondary | ICD-10-CM | POA: Insufficient documentation

## 2013-08-31 DIAGNOSIS — N950 Postmenopausal bleeding: Secondary | ICD-10-CM | POA: Insufficient documentation

## 2013-08-31 HISTORY — PX: HYSTEROSCOPY WITH D & C: SHX1775

## 2013-08-31 SURGERY — DILATATION AND CURETTAGE /HYSTEROSCOPY
Anesthesia: General | Site: Uterus

## 2013-08-31 MED ORDER — MIDAZOLAM HCL 2 MG/2ML IJ SOLN
INTRAMUSCULAR | Status: AC
  Filled 2013-08-31: qty 2

## 2013-08-31 MED ORDER — LIDOCAINE HCL 1 % IJ SOLN
INTRAMUSCULAR | Status: DC | PRN
  Administered 2013-08-31: 10 mL

## 2013-08-31 MED ORDER — MIDAZOLAM HCL 2 MG/2ML IJ SOLN
INTRAMUSCULAR | Status: DC | PRN
  Administered 2013-08-31: 2 mg via INTRAVENOUS

## 2013-08-31 MED ORDER — KETOROLAC TROMETHAMINE 30 MG/ML IJ SOLN
INTRAMUSCULAR | Status: AC
  Filled 2013-08-31: qty 1

## 2013-08-31 MED ORDER — FENTANYL CITRATE 0.05 MG/ML IJ SOLN
INTRAMUSCULAR | Status: DC | PRN
  Administered 2013-08-31 (×2): 50 ug via INTRAVENOUS

## 2013-08-31 MED ORDER — PROPOFOL 10 MG/ML IV EMUL
INTRAVENOUS | Status: AC
  Filled 2013-08-31: qty 20

## 2013-08-31 MED ORDER — LIDOCAINE HCL (CARDIAC) 20 MG/ML IV SOLN
INTRAVENOUS | Status: AC
  Filled 2013-08-31: qty 5

## 2013-08-31 MED ORDER — ONDANSETRON HCL 4 MG/2ML IJ SOLN
INTRAMUSCULAR | Status: DC | PRN
  Administered 2013-08-31: 4 mg via INTRAVENOUS

## 2013-08-31 MED ORDER — LIDOCAINE HCL 1 % IJ SOLN
INTRAMUSCULAR | Status: AC
  Filled 2013-08-31: qty 20

## 2013-08-31 MED ORDER — CLINDAMYCIN PHOSPHATE 900 MG/50ML IV SOLN
900.0000 mg | Freq: Once | INTRAVENOUS | Status: AC
  Administered 2013-08-31: 900 mg via INTRAVENOUS
  Filled 2013-08-31: qty 50

## 2013-08-31 MED ORDER — HYDROCODONE-IBUPROFEN 7.5-200 MG PO TABS: 1.0000 | ORAL_TABLET | Freq: Three times a day (TID) | ORAL | Status: DC | PRN

## 2013-08-31 MED ORDER — ONDANSETRON HCL 4 MG/2ML IJ SOLN
INTRAMUSCULAR | Status: AC
  Filled 2013-08-31: qty 2

## 2013-08-31 MED ORDER — SODIUM CHLORIDE 0.9 % IR SOLN
Status: DC | PRN
  Administered 2013-08-31: 1

## 2013-08-31 MED ORDER — FENTANYL CITRATE 0.05 MG/ML IJ SOLN
INTRAMUSCULAR | Status: AC
  Filled 2013-08-31: qty 2

## 2013-08-31 MED ORDER — DEXAMETHASONE SODIUM PHOSPHATE 10 MG/ML IJ SOLN
INTRAMUSCULAR | Status: DC | PRN
  Administered 2013-08-31: 10 mg via INTRAVENOUS

## 2013-08-31 MED ORDER — KETOROLAC TROMETHAMINE 30 MG/ML IJ SOLN
INTRAMUSCULAR | Status: DC | PRN
  Administered 2013-08-31: 30 mg via INTRAVENOUS

## 2013-08-31 MED ORDER — DEXAMETHASONE SODIUM PHOSPHATE 10 MG/ML IJ SOLN
INTRAMUSCULAR | Status: AC
  Filled 2013-08-31: qty 1

## 2013-08-31 MED ORDER — LIDOCAINE HCL (CARDIAC) 20 MG/ML IV SOLN
INTRAVENOUS | Status: DC | PRN
  Administered 2013-08-31: 80 mg via INTRAVENOUS

## 2013-08-31 MED ORDER — LACTATED RINGERS IV SOLN
INTRAVENOUS | Status: DC
  Administered 2013-08-31 (×2): via INTRAVENOUS

## 2013-08-31 MED ORDER — FENTANYL CITRATE 0.05 MG/ML IJ SOLN: 25.0000 ug | INTRAMUSCULAR | Status: DC | PRN

## 2013-08-31 SURGICAL SUPPLY — 18 items
CANISTER SUCT 3000ML (MISCELLANEOUS) ×4 IMPLANT
CATH ROBINSON RED A/P 16FR (CATHETERS) ×4 IMPLANT
CLOTH BEACON ORANGE TIMEOUT ST (SAFETY) ×4 IMPLANT
CONTAINER PREFILL 10% NBF 60ML (FORM) ×5 IMPLANT
DRAPE HYSTEROSCOPY (DRAPE) ×4 IMPLANT
DRSG TELFA 3X8 NADH (GAUZE/BANDAGES/DRESSINGS) ×4 IMPLANT
ELECTRODE RT ANGLE VERSAPOINT (CUTTING LOOP) ×4 IMPLANT
GLOVE BIO SURGEON STRL SZ 6.5 (GLOVE) ×3 IMPLANT
GLOVE BIO SURGEONS STRL SZ 6.5 (GLOVE) ×1
GOWN STRL REUS W/TWL LRG LVL3 (GOWN DISPOSABLE) ×8 IMPLANT
PACK VAGINAL MINOR WOMEN LF (CUSTOM PROCEDURE TRAY) ×4 IMPLANT
PAD DRESSING TELFA 3X8 NADH (GAUZE/BANDAGES/DRESSINGS) ×1 IMPLANT
PAD OB MATERNITY 4.3X12.25 (PERSONAL CARE ITEMS) ×4 IMPLANT
SET TUBING HYSTEROSCOPY 2 NDL (TUBING) ×3 IMPLANT
SYR CONTROL 10ML LL (SYRINGE) ×4 IMPLANT
TOWEL OR 17X24 6PK STRL BLUE (TOWEL DISPOSABLE) ×8 IMPLANT
TUBE HYSTEROSCOPY W Y-CONNECT (TUBING) ×3 IMPLANT
WATER STERILE IRR 1000ML POUR (IV SOLUTION) ×4 IMPLANT

## 2013-08-31 NOTE — Op Note (Signed)
NAME:  Leslie May, Leslie May               ACCOUNT NO.:  [id number]  MEDICAL RECORD NO.:  [id number]  LOCATION:  [facility]                          FACILITY:  [facility]  PHYSICIAN:  Marylynn Pearson, MD    DATE OF BIRTH:  May 09, 1961  DATE OF PROCEDURE: DATE OF DISCHARGE:                              OPERATIVE REPORT   PREOPERATIVE DIAGNOSES: 1. Irregular bleeding. 2. Endometrial polyp.  POSTOPERATIVE DIAGNOSES: 1. Irregular bleeding. 2. Endometrial polyp.  PROCEDURE: 1. Cervical block. 2. Hysteroscopy. 3. D and C with removal of endometrial polyps.  SURGEON:  Marylynn Pearson, MD  ANESTHESIA:  General.  SPECIMEN:  Endometrial curettings with polyps.  COMPLICATIONS:  None.  CONDITION:  Stable to recovery room.  DESCRIPTION OF PROCEDURE:  The patient was taken to the operating room. After informed consent was obtained, she was given general anesthesia, prepped and draped in sterile fashion.  An in- and out catheter was used to drain her bladder.  Bivalve speculum was placed in the vagina and 1% lidocaine was used to provide a cervical block.  Single-tooth tenaculum was attached to the anterior lip of the cervix and the cervix was serially dilated using Pratt dilators.  Diagnostic hysteroscope was inserted.  A few endometrial polyps were noted at the fundal portion of the uterus near the left ostia.  Bilateral tubal ostia were visualized and appeared normal.  The hysteroscope was removed and a gentle curetting was performed throughout.  Specimen was placed on Telfa and passed off to be sent to Pathology.  Hysteroscope was reinserted.  No further polypoid masses or abnormalities were identified.  All instruments were then removed.  The tenaculum was removed from the cervix.  The cervix was hemostatic.  Speculum was removed.  She was taken to the recovery room in stable condition.  Sponge, lap, needle, and instrument counts were correct x2.     Marylynn Pearson, MD     GA/MEDQ  D:   08/31/2013  T:  08/31/2013  Job:  [id number]

## 2013-08-31 NOTE — Transfer of Care (Signed)
Immediate Anesthesia Transfer of Care Note  Patient: Leslie May  Procedure(s) Performed: Procedure(s): DILATATION & CURETTAGE/HYSTEROSCOPY WITH VERSAPOINT RESECTION (N/A)  Patient Location: PACU  Anesthesia Type:General  Level of Consciousness: awake, alert  and oriented  Airway & Oxygen Therapy: Patient Spontanous Breathing and Patient connected to nasal cannula oxygen  Post-op Assessment: Report given to PACU RN, Post -op Vital signs reviewed and stable and Patient moving all extremities  Post vital signs: Reviewed and stable  Complications: No apparent anesthesia complications

## 2013-08-31 NOTE — Discharge Instructions (Signed)
[facility] office 2-3 weeks for postop appointment.  Call the office [phone] for an appointment. ° °Personal Hygiene: °Use pads not tampons x 1week °You may shower, no tub baths or pools for 2-3 weeks °Wipe from front to back when using restroom ° °Activity: °Do not drive or operate any equipment for 24 hrs.   °Do not rest in bed all day °Walking is encouraged °Walk up and down stairs slowly °You may return to your normal activity in 1-2 days ° °Sexual Activity:  No intercourse for 2 weeks after the procedure. ° °Diet: Eat a light meal as desired this evening.  You may resume your usual diet tomorrow. ° °Return to work:  You may resume your work activities after 1-2 days ° °What to expect:  Expect to have vaginal bleeding/discharge for 2-3 days and spotting for 10-14 days.  It is not unusual to have soreness for 1-2 weeks.  You may have a slight burning sensation when you urinate for the first few days.  You may start your menses in 2-6 weeks.  Mild cramps may continue for a couple of days.   ° °Call your doctor:   °Excessive bleeding, saturating a pad every hour °Inability to urinate 6 hours after discharge °Pain not relieved with pain medications °Fever of 100.4 or greater ° °

## 2013-08-31 NOTE — Anesthesia Preprocedure Evaluation (Signed)
Anesthesia Evaluation  Patient identified by MRN, date of birth, ID band Patient awake    Reviewed: Allergy & Precautions, H&P , Patient's Chart, lab work & pertinent test results, reviewed documented beta blocker date and time   Airway Mallampati: II TM Distance: >3 FB Neck ROM: full    Dental no notable dental hx.    Pulmonary sleep apnea (Had sx of OSA, lost weight and it got better) , Current Smoker,  breath sounds clear to auscultation  Pulmonary exam normal       Cardiovascular hypertension, On Medications Rhythm:regular Rate:Normal     Neuro/Psych    GI/Hepatic   Endo/Other  Morbid obesity  Renal/GU      Musculoskeletal   Abdominal   Peds  Hematology   Anesthesia Other Findings   Reproductive/Obstetrics                           Anesthesia Physical Anesthesia Plan  ASA: III  Anesthesia Plan:    Post-op Pain Management:    Induction: Intravenous  Airway Management Planned: LMA  Additional Equipment:   Intra-op Plan:   Post-operative Plan:   Informed Consent: I have reviewed the patients History and Physical, chart, labs and discussed the procedure including the risks, benefits and alternatives for the proposed anesthesia with the patient or authorized representative who has indicated his/her understanding and acceptance.   Dental Advisory Given and Dental advisory given  Plan Discussed with: CRNA and Surgeon  Anesthesia Plan Comments: (Discussed GA with LMA, possible sore throat, potential need to switch to ETT, N/V, pulmonary aspiration. Questions answered. )        Anesthesia Quick Evaluation

## 2013-09-01 NOTE — Anesthesia Postprocedure Evaluation (Signed)
  Anesthesia Post-op Note  Patient: Leslie May  Procedure(s) Performed: Procedure(s): DILATATION AND CURETTAGE /HYSTEROSCOPY (N/A)  Patient Location: PACU  Anesthesia Type:General  Level of Consciousness: awake, alert  and oriented  Airway and Oxygen Therapy: Patient Spontanous Breathing  Post-op Pain: none  Post-op Assessment: Post-op Vital signs reviewed, Patient's Cardiovascular Status Stable, Respiratory Function Stable, Patent Airway, No signs of Nausea or vomiting and Pain level controlled  Post-op Vital Signs: Reviewed and stable  Last Vitals:  Filed Vitals:   08/31/13 0845  BP:   Pulse:   Temp: 37 C  Resp:     Complications: No apparent anesthesia complications

## 2013-09-03 ENCOUNTER — Encounter (HOSPITAL_COMMUNITY): Payer: Self-pay | Admitting: Obstetrics and Gynecology

## 2013-09-06 ENCOUNTER — Other Ambulatory Visit: Payer: Self-pay | Admitting: *Deleted

## 2013-09-06 MED ORDER — OMEPRAZOLE 20 MG PO CPDR: 20.0000 mg | DELAYED_RELEASE_CAPSULE | Freq: Every day | ORAL | Status: DC

## 2013-09-06 NOTE — Telephone Encounter (Signed)
Refill appropriate and filled per protocol. 

## 2013-10-08 ENCOUNTER — Encounter: Payer: Self-pay | Admitting: *Deleted

## 2013-10-08 ENCOUNTER — Other Ambulatory Visit: Payer: Self-pay | Admitting: *Deleted

## 2013-10-08 MED ORDER — OMEPRAZOLE 20 MG PO CPDR: 20.0000 mg | DELAYED_RELEASE_CAPSULE | Freq: Every day | ORAL | Status: DC

## 2013-10-08 NOTE — Telephone Encounter (Signed)
Medication filled x1 with no refills.   Requires office visit before any further refills can be given.   Letter sent.  

## 2013-10-09 ENCOUNTER — Other Ambulatory Visit: Payer: Self-pay | Admitting: *Deleted

## 2013-10-09 MED ORDER — OMEPRAZOLE 20 MG PO CPDR: 20.0000 mg | DELAYED_RELEASE_CAPSULE | Freq: Every day | ORAL | Status: DC

## 2013-10-22 ENCOUNTER — Ambulatory Visit (INDEPENDENT_AMBULATORY_CARE_PROVIDER_SITE_OTHER): Payer: BC Managed Care – PPO | Admitting: Family Medicine

## 2013-10-22 ENCOUNTER — Encounter: Payer: Self-pay | Admitting: Family Medicine

## 2013-10-22 VITALS — BP 138/76 | HR 72 | Temp 98.5°F | Resp 16 | Ht 64.5 in | Wt 209.0 lb

## 2013-10-22 DIAGNOSIS — Z0000 Encounter for general adult medical examination without abnormal findings: Secondary | ICD-10-CM

## 2013-10-22 DIAGNOSIS — I10 Essential (primary) hypertension: Secondary | ICD-10-CM

## 2013-10-22 DIAGNOSIS — R011 Cardiac murmur, unspecified: Secondary | ICD-10-CM

## 2013-10-22 DIAGNOSIS — Z720 Tobacco use: Secondary | ICD-10-CM

## 2013-10-22 DIAGNOSIS — F17200 Nicotine dependence, unspecified, uncomplicated: Secondary | ICD-10-CM

## 2013-10-22 DIAGNOSIS — E669 Obesity, unspecified: Secondary | ICD-10-CM

## 2013-10-22 HISTORY — DX: Encounter for general adult medical examination without abnormal findings: Z00.00

## 2013-10-22 MED ORDER — VERAPAMIL HCL ER 300 MG PO CP24: 1.0000 | ORAL_CAPSULE | Freq: Every day | ORAL | Status: DC

## 2013-10-22 MED ORDER — OMEPRAZOLE 20 MG PO CPDR: 20.0000 mg | DELAYED_RELEASE_CAPSULE | Freq: Every day | ORAL | Status: DC

## 2013-10-22 NOTE — Assessment & Plan Note (Signed)
Discussed dietary changes she restarted Weight Watchers 2 weeks ago and is also doing cross.fit

## 2013-10-22 NOTE — Progress Notes (Signed)
Patient ID: Leslie May, female   DOB: 1960/12/09, 53 y.o.   MRN: [id number]   Subjective:    Patient ID: Leslie May, female    DOB: 1960-11-17, 53 y.o.   MRN: [id number]  Patient presents for CPE and BP check  patient here for physical exam and for medication refill. . She has had some mild congestion in the morning since her surgery, no wheezing no shortness of breath. She continues to smoke  She is being followed by GYN she had dilation and curettage done secondary to the abnormal menses and heavy bleeding her periods have now stopped. She had a mammogram done by her GYN. She declines colonoscopy. She's due to have 3 biometric screening labs in November and wants to hold off on her lipid panel until then. I did review a metabolic panel and CBC she had done in April. She's been taking her blood pressure medication without any difficulty her blood pressure has been running good per report    Review Of Systems:  GEN- denies fatigue, fever, weight loss,weakness, recent illness HEENT- denies eye drainage, change in vision, nasal discharge, CVS- denies chest pain, palpitations RESP- denies SOB, cough, wheeze ABD- denies N/V, change in stools, abd pain GU- denies dysuria, hematuria, dribbling, incontinence MSK- denies joint pain, muscle aches, injury Neuro- denies headache, dizziness, syncope, seizure activity       Objective:    BP 138/76  Pulse 72  Temp(Src) 98.5 F (36.9 C) (Oral)  Resp 16  Ht 5' 4.5" (1.638 m)  Wt 209 lb (94.802 kg)  BMI 35.33 kg/m2 GEN- NAD, alert and oriented x3 HEENT- PERRL, EOMI, non injected sclera, pink conjunctiva, MMM, oropharynx clear, nares clear, TM clear bilat Neck- Supple, no thyromegaly CVS- RRR, soft 2/6 SEM RESP-CTAB ABD-NABS,soft,NT,ND GU- Deferred to GYN EXT- No edema Pulses- Radial, DP- 2+        Assessment & Plan:      Problem List Items Addressed This Visit   None      Note: This dictation was prepared with Dragon  dictation along with smaller phrase technology. Any transcriptional errors that result from this process are unintentional.

## 2013-10-22 NOTE — Assessment & Plan Note (Signed)
GYN is deferred. Mammogram up to date. She declines colonoscopy will give her stool cards today. She will have lipid panel done with her primary screening in November. Her immunizations are up-to-date

## 2013-10-22 NOTE — Assessment & Plan Note (Signed)
Soft systolic murmur heard today we will monitor this for now she has no signs of digestive heart failure or liver disease

## 2013-10-22 NOTE — Patient Instructions (Signed)
Release of records- [facility]- Mammogram , Last PAP , last Office Note  Get biometric screening Quit smoking Try robitussin or mucinex  F/U 1 year

## 2013-10-22 NOTE — Assessment & Plan Note (Signed)
Blood pressure was stable continue her on verapamil

## 2013-10-22 NOTE — Assessment & Plan Note (Signed)
Examination is benign no congestion noted today. Encourage her to quit tobacco

## 2014-01-31 ENCOUNTER — Other Ambulatory Visit: Payer: Self-pay | Admitting: Family Medicine

## 2014-01-31 NOTE — Telephone Encounter (Signed)
Medication refilled per protocol. 

## 2014-02-01 ENCOUNTER — Encounter: Payer: Self-pay | Admitting: Family Medicine

## 2014-02-01 ENCOUNTER — Ambulatory Visit (INDEPENDENT_AMBULATORY_CARE_PROVIDER_SITE_OTHER): Payer: BC Managed Care – PPO | Admitting: Family Medicine

## 2014-02-01 VITALS — BP 138/82 | HR 78 | Temp 98.1°F | Resp 16 | Ht 65.0 in | Wt 213.0 lb

## 2014-02-01 DIAGNOSIS — Z23 Encounter for immunization: Secondary | ICD-10-CM

## 2014-02-01 DIAGNOSIS — L0293 Carbuncle, unspecified: Secondary | ICD-10-CM

## 2014-02-01 DIAGNOSIS — L0292 Furuncle, unspecified: Secondary | ICD-10-CM

## 2014-02-01 MED ORDER — VERAPAMIL HCL ER 300 MG PO CP24: 1.0000 | ORAL_CAPSULE | Freq: Every day | ORAL | Status: DC

## 2014-02-01 MED ORDER — CEPHALEXIN 500 MG PO CAPS: 500.0000 mg | ORAL_CAPSULE | Freq: Two times a day (BID) | ORAL | Status: DC

## 2014-02-01 MED ORDER — OMEPRAZOLE 20 MG PO CPDR: DELAYED_RELEASE_CAPSULE | ORAL | Status: DC

## 2014-02-01 NOTE — Patient Instructions (Signed)
Keflex 500mg  twice a day Flu shot given F/U needed

## 2014-02-01 NOTE — Progress Notes (Signed)
Patient ID: Leslie May, female   DOB: 1961/03/25, 53 y.o.   MRN: [id number]   Subjective:    Patient ID: Leslie May, female    DOB: 1960-09-02, 53 y.o.   MRN: [id number]  Patient presents for Blister to L inner thigh  Patient here with a boil to her inner left thigh for the past couple weeks. She states initially started as a small pimple and then an enlarged. She did use Neosporin and start to drain about 2 days ago but is still tender to touch but much improved regarding the side She did have a Band-Aid placed over but she had a reaction to the planned date that blister on the edges   Review Of Systems:  GEN- denies fatigue, fever, weight loss,weakness, recent illness HEENT- denies eye drainage, change in vision, nasal discharge, CVS- denies chest pain, palpitations RESP- denies SOB, cough, wheeze ABD- denies N/V, change in stools, abd pain Neuro- denies headache, dizziness, syncope, seizure activity       Objective:    BP 138/82  Pulse 78  Temp(Src) 98.1 F (36.7 C) (Oral)  Resp 16  Ht 5\' 5"  (1.651 m)  Wt 213 lb (96.616 kg)  BMI 35.44 kg/m2 GEN- NAD, alert and oriented x3 Skin- left inner thigh nickle size boil, minimal flunctance, no drainage , scab at center no induration, + erythema, mild TTP Ext- no edema        Assessment & Plan:      Problem List Items Addressed This Visit   None    Visit Diagnoses   Boil    -  Primary    No need for I & D , keflex , continue warm compresses, already improved    Relevant Medications       cephALEXin (KEFLEX) capsule       Note: This dictation was prepared with Dragon dictation along with smaller phrase technology. Any transcriptional errors that result from this process are unintentional.

## 2014-02-04 NOTE — Addendum Note (Signed)
Addended by: Sheral Flow on: 02/04/2014 07:52 AM   Modules accepted: Orders

## 2014-03-04 ENCOUNTER — Other Ambulatory Visit: Payer: Self-pay | Admitting: Obstetrics and Gynecology

## 2014-03-05 LAB — CYTOLOGY - PAP

## 2015-01-10 ENCOUNTER — Telehealth: Payer: Self-pay | Admitting: Family Medicine

## 2015-01-10 MED ORDER — METHYLPREDNISOLONE 4 MG PO TBPK: ORAL_TABLET | ORAL | Status: DC

## 2015-01-10 NOTE — Telephone Encounter (Signed)
Rx to pharmacy and pt aware 

## 2015-01-10 NOTE — Telephone Encounter (Signed)
Send in Medrol dosepak

## 2015-01-10 NOTE — Telephone Encounter (Signed)
Pt has broken out with poison oak.  Helping husband with yard work.  In between insurance, husband has new job.  Can you call something in??

## 2015-02-03 ENCOUNTER — Other Ambulatory Visit: Payer: Self-pay | Admitting: Family Medicine

## 2015-02-03 NOTE — Telephone Encounter (Signed)
Medication refilled per protocol. 

## 2015-02-04 ENCOUNTER — Other Ambulatory Visit: Payer: Self-pay | Admitting: *Deleted

## 2015-02-04 ENCOUNTER — Encounter: Payer: Self-pay | Admitting: *Deleted

## 2015-02-04 MED ORDER — VERAPAMIL HCL ER 300 MG PO CP24
1.0000 | ORAL_CAPSULE | Freq: Every day | ORAL | Status: DC
Start: 2015-02-04 — End: 2015-03-31

## 2015-02-04 MED ORDER — OMEPRAZOLE 20 MG PO CPDR: 20.0000 mg | DELAYED_RELEASE_CAPSULE | Freq: Every day | ORAL | Status: DC

## 2015-02-04 NOTE — Telephone Encounter (Signed)
Received fax requesting refill on Omeprazole and Verapamil.   Medication filled x1 with no refills.   Requires office visit before any further refills can be given.   Letter sent.

## 2015-03-31 ENCOUNTER — Encounter: Payer: Self-pay | Admitting: Family Medicine

## 2015-03-31 ENCOUNTER — Encounter: Payer: Self-pay | Admitting: *Deleted

## 2015-03-31 ENCOUNTER — Ambulatory Visit (INDEPENDENT_AMBULATORY_CARE_PROVIDER_SITE_OTHER): Payer: Managed Care, Other (non HMO) | Admitting: Family Medicine

## 2015-03-31 VITALS — BP 140/86 | HR 88 | Temp 98.7°F | Resp 16 | Ht 65.0 in | Wt 219.0 lb

## 2015-03-31 DIAGNOSIS — Z23 Encounter for immunization: Secondary | ICD-10-CM | POA: Diagnosis not present

## 2015-03-31 DIAGNOSIS — E669 Obesity, unspecified: Secondary | ICD-10-CM | POA: Diagnosis not present

## 2015-03-31 DIAGNOSIS — Z720 Tobacco use: Secondary | ICD-10-CM

## 2015-03-31 DIAGNOSIS — I10 Essential (primary) hypertension: Secondary | ICD-10-CM | POA: Diagnosis not present

## 2015-03-31 MED ORDER — VERAPAMIL HCL ER 300 MG PO CP24: 1.0000 | ORAL_CAPSULE | Freq: Every day | ORAL | Status: DC

## 2015-03-31 MED ORDER — OMEPRAZOLE 20 MG PO CPDR: 20.0000 mg | DELAYED_RELEASE_CAPSULE | Freq: Every day | ORAL | Status: DC

## 2015-03-31 MED ORDER — NICOTINE 21 MG/24HR TD PT24: 21.0000 mg | MEDICATED_PATCH | Freq: Every day | TRANSDERMAL | Status: DC

## 2015-03-31 NOTE — Progress Notes (Signed)
Patient ID: Leslie May, female   DOB: Oct 24, 1960, 54 y.o.   MRN: [id number]   Subjective:    Patient ID: Leslie May, female    DOB: June 30, 1960, 55 y.o.   MRN: [id number]  Patient presents for F/U  Pt here for follow-up. No new concerns today. She has gained back  The weight she preveiously lost, now up to 219lbs. She is not working out, she stopped following Weight Watchers. States she has been stress eating at times but then has been lazy and not wanting to follow her meal plans. Taking BP meds as prescribed and prilosec. She is smoking 2ppd now, wants to try and quit. Her husband had SI with Chantix so she doesn't want to try this. She can get Nicotine patches for free . Has GYN follow up in Jan.     Review Of Systems:  GEN- denies fatigue, fever, weight loss,weakness, recent illness HEENT- denies eye drainage, change in vision, nasal discharge, CVS- denies chest pain, palpitations RESP- denies SOB, cough, wheeze ABD- denies N/V, change in stools, abd pain GU- denies dysuria, hematuria, dribbling, incontinence MSK- denies joint pain, muscle aches, injury Neuro- denies headache, dizziness, syncope, seizure activity       Objective:    BP 140/86 mmHg  Pulse 88  Temp(Src) 98.7 F (37.1 C) (Oral)  Resp 16  Ht 5\' 5"  (1.651 m)  Wt 219 lb (99.338 kg)  BMI 36.44 kg/m2 GEN- NAD, alert and oriented x3,obese HEENT- PERRL, EOMI, non injected sclera, pink conjunctiva, MMM, oropharynx clear Neck- Supple, no thyromegaly CVS- RRR, no murmur RESP-CTAB Patient ID: Leslie SCHMIDBAUER, female   DOB: [birth year], 54 y.o.   MRN: [id number]   Subjective:    Patient ID: Leslie May, female    DOB: Jul 18, 1960, 54 y.o.   MRN: [id number]  Patient presents for F/U  patient here for physical exam and for medication refill. . She has had some mild congestion in the morning since her surgery, no wheezing no shortness of breath. She continues to smoke  She is being followed by GYN she had  dilation and curettage done secondary to the abnormal menses and heavy bleeding her periods have now stopped. She had a mammogram done by her GYN. She declines colonoscopy. She's due to have 3 biometric screening labs in November and wants to hold off on her lipid panel until then. I did review a metabolic panel and CBC she had done in April. She's been taking her blood pressure medication without any difficulty her blood pressure has been running good per report    Review Of Systems:  GEN- denies fatigue, fever, weight loss,weakness, recent illness HEENT- denies eye drainage, change in vision, nasal discharge, CVS- denies chest pain, palpitations RESP- denies SOB, cough, wheeze ABD- denies N/V, change in stools, abd pain GU- denies dysuria, hematuria, dribbling, incontinence MSK- denies joint pain, muscle aches, injury Neuro- denies headache, dizziness, syncope, seizure activity       Objective:    BP 140/86 mmHg  Pulse 88  Temp(Src) 98.7 F (37.1 C) (Oral)  Resp 16  Ht 5\' 5"  (1.651 m)  Wt 219 lb (99.338 kg)  BMI 36.44 kg/m2 GEN- NAD, alert and oriented x3 HEENT- PERRL, EOMI, non injected sclera, pink conjunctiva, MMM, oropharynx clear, nares clear, TM clear bilat Neck- Supple, no thyromegaly CVS- RRR, soft 2/6 SEM RESP-CTAB ABD-NABS,soft,NT,ND GU- Deferred to GYN EXT- No edema Pulses- Radial, DP- 2+        Assessment & Plan:  Problem List Items Addressed This Visit    Tobacco use   Obesity   Essential hypertension - Primary   Relevant Orders   CBC with Differential/Platelet   Comprehensive metabolic panel   Lipid panel      Note: This dictation was prepared with Dragon dictation along with smaller phrase technology. Any transcriptional errors that result from this process are unintentional.     EXT- No edema Pulses- Radial, DP- 2+        Assessment & Plan:      Problem List Items Addressed This Visit    Tobacco use   Obesity   Essential  hypertension - Primary   Relevant Orders   CBC with Differential/Platelet   Comprehensive metabolic panel   Lipid panel      Note: This dictation was prepared with Dragon dictation along with smaller phrase technology. Any transcriptional errors that result from this process are unintentional.

## 2015-03-31 NOTE — Patient Instructions (Addendum)
Get the 21mg  once a day a patch for smoking Restart your weight watchers Pneumonia shot given F/U 6 months

## 2015-03-31 NOTE — Assessment & Plan Note (Signed)
BP high normal today, needs to work on weight loss and nutrition. Will get fasting labs done at GYN office For now continue cardizem. If BP continues to trend up, then will add her Benicar back on

## 2015-03-31 NOTE — Assessment & Plan Note (Addendum)
Will start nicoderm 21mg  patches  pneumonia shot given

## 2015-03-31 NOTE — Assessment & Plan Note (Signed)
Per above, discussed weight watchers

## 2015-04-01 ENCOUNTER — Telehealth: Payer: Self-pay | Admitting: Family Medicine

## 2015-06-25 ENCOUNTER — Telehealth: Payer: Self-pay | Admitting: *Deleted

## 2015-06-25 NOTE — Telephone Encounter (Signed)
Pt called stating she is has appt scheduled at [facility] on Friday Feb 17 and would like to have blood work done at that time and if I could fax over the orders to them, lab orders were faxed to [facility] today

## 2015-07-03 ENCOUNTER — Other Ambulatory Visit: Payer: Self-pay | Admitting: *Deleted

## 2015-07-03 ENCOUNTER — Telehealth: Payer: Self-pay | Admitting: *Deleted

## 2015-07-03 MED ORDER — VITAMIN D (ERGOCALCIFEROL) 1.25 MG (50000 UNIT) PO CAPS: 50000.0000 [IU] | ORAL_CAPSULE | ORAL | Status: DC

## 2015-07-03 MED ORDER — FISH OIL 1000 MG PO CAPS: ORAL_CAPSULE | ORAL | Status: AC

## 2015-07-03 NOTE — Telephone Encounter (Signed)
Received lab results from Dr. Julien Girt.   MD recommends that patient come in for appt to discuss results.   Appointment scheduled.

## 2015-07-07 ENCOUNTER — Telehealth: Payer: Self-pay | Admitting: Family Medicine

## 2015-07-07 ENCOUNTER — Encounter: Payer: Self-pay | Admitting: Family Medicine

## 2015-07-07 ENCOUNTER — Ambulatory Visit (INDEPENDENT_AMBULATORY_CARE_PROVIDER_SITE_OTHER): Payer: Managed Care, Other (non HMO) | Admitting: Family Medicine

## 2015-07-07 ENCOUNTER — Other Ambulatory Visit: Payer: Self-pay | Admitting: Family Medicine

## 2015-07-07 VITALS — BP 148/90 | HR 80 | Temp 98.9°F | Resp 20 | Wt 219.0 lb

## 2015-07-07 DIAGNOSIS — E781 Pure hyperglyceridemia: Secondary | ICD-10-CM

## 2015-07-07 DIAGNOSIS — E119 Type 2 diabetes mellitus without complications: Secondary | ICD-10-CM | POA: Insufficient documentation

## 2015-07-07 DIAGNOSIS — E669 Obesity, unspecified: Secondary | ICD-10-CM

## 2015-07-07 DIAGNOSIS — E1165 Type 2 diabetes mellitus with hyperglycemia: Secondary | ICD-10-CM

## 2015-07-07 DIAGNOSIS — IMO0001 Reserved for inherently not codable concepts without codable children: Secondary | ICD-10-CM

## 2015-07-07 DIAGNOSIS — I10 Essential (primary) hypertension: Secondary | ICD-10-CM | POA: Diagnosis not present

## 2015-07-07 DIAGNOSIS — E785 Hyperlipidemia, unspecified: Secondary | ICD-10-CM | POA: Insufficient documentation

## 2015-07-07 LAB — CBC WITH DIFFERENTIAL/PLATELET
BASOS PCT: 0 % (ref 0–1)
Basophils Absolute: 0 10*3/uL (ref 0.0–0.1)
Eosinophils Absolute: 0.2 10*3/uL (ref 0.0–0.7)
Eosinophils Relative: 2 % (ref 0–5)
HEMATOCRIT: 46.2 % — AB (ref 36.0–46.0)
HEMOGLOBIN: 15.4 g/dL — AB (ref 12.0–15.0)
Lymphocytes Relative: 37 % (ref 12–46)
Lymphs Abs: 3.9 10*3/uL (ref 0.7–4.0)
MCH: 29.1 pg (ref 26.0–34.0)
MCHC: 33.3 g/dL (ref 30.0–36.0)
MCV: 87.2 fL (ref 78.0–100.0)
MONO ABS: 0.5 10*3/uL (ref 0.1–1.0)
MONOS PCT: 5 % (ref 3–12)
MPV: 10.6 fL (ref 8.6–12.4)
NEUTROS ABS: 5.9 10*3/uL (ref 1.7–7.7)
Neutrophils Relative %: 56 % (ref 43–77)
Platelets: 289 10*3/uL (ref 150–400)
RBC: 5.3 MIL/uL — ABNORMAL HIGH (ref 3.87–5.11)
RDW: 13.6 % (ref 11.5–15.5)
WBC: 10.6 10*3/uL — ABNORMAL HIGH (ref 4.0–10.5)

## 2015-07-07 LAB — COMPREHENSIVE METABOLIC PANEL
ALK PHOS: 106 U/L (ref 33–130)
ALT: 23 U/L (ref 6–29)
AST: 27 U/L (ref 10–35)
Albumin: 4 g/dL (ref 3.6–5.1)
BUN: 14 mg/dL (ref 7–25)
CO2: 28 mmol/L (ref 20–31)
CREATININE: 0.73 mg/dL (ref 0.50–1.05)
Calcium: 9.5 mg/dL (ref 8.6–10.4)
Chloride: 101 mmol/L (ref 98–110)
GLUCOSE: 229 mg/dL — AB (ref 70–99)
POTASSIUM: 3.7 mmol/L (ref 3.5–5.3)
SODIUM: 138 mmol/L (ref 135–146)
Total Bilirubin: 0.3 mg/dL (ref 0.2–1.2)
Total Protein: 7 g/dL (ref 6.1–8.1)

## 2015-07-07 LAB — GLUCOSE, FINGERSTICK (STAT): GLUCOSE, FINGERSTICK: 230 mg/dL — AB (ref 70–99)

## 2015-07-07 MED ORDER — ESTRADIOL 0.05 MG/24HR TD PTWK: 0.0500 mg | MEDICATED_PATCH | TRANSDERMAL | Status: DC

## 2015-07-07 MED ORDER — METFORMIN HCL 500 MG PO TABS: 500.0000 mg | ORAL_TABLET | Freq: Two times a day (BID) | ORAL | Status: DC

## 2015-07-07 MED ORDER — OLMESARTAN MEDOXOMIL 20 MG PO TABS: 10.0000 mg | ORAL_TABLET | Freq: Every day | ORAL | Status: DC

## 2015-07-07 NOTE — Assessment & Plan Note (Signed)
Uncontrolled, restart benicar 10mg  in addition to her verapamil at bedtime Discussed healthy eating, exercise, weight loss

## 2015-07-07 NOTE — Assessment & Plan Note (Signed)
New diagnosis of DM, A1C 8.5%, check renal function today TG elevated in setting of DM Start MTF 500mg  BID per instructions Test CBG daily

## 2015-07-07 NOTE — Telephone Encounter (Signed)
Script sent to pharmacy.

## 2015-07-07 NOTE — Telephone Encounter (Signed)
Pt was advised to take 1/2 of the [place] 20 mg tabs that she had at home. The medication has expired so she will need a refill.  Krist[facility] [place] Pt's number [phone]

## 2015-07-07 NOTE — Progress Notes (Signed)
Patient ID: Leslie May, female   DOB: [birth year], 55 y.o.   MRN: [id number]   Subjective:    Patient ID: Leslie May, female    DOB: 11-May-1960, 55 y.o.   MRN: [id number]  Patient presents for Follow-up   Patient here for follow-up on chronic medical problems and her recent labs from her OB/GYN.  she has history of hypertension as well as obesity. Her GYN which showed hypertriglyceridemia as well his diabetes mellitus with A1c 8.5% her thyroid function studies were normal. The labs were done on 06/27/2015. She admits to polyuria, and polydipsia, also feels tired all the time. Strong family history of DM- Parents and sister.  She has not been following her [employer] diet, eating a lot of junk food.   Review Of Systems:  GEN- + fatigue, fever, weight loss,weakness, recent illness HEENT- denies eye drainage, change in vision, nasal discharge, CVS- denies chest pain, palpitations RESP- denies SOB, cough, wheeze ABD- denies N/V, change in stools, abd pain Neuro- denies headache, dizziness, syncope, seizure activity       Objective:    BP 148/90 mmHg  Pulse 80  Temp(Src) 98.9 F (37.2 C) (Oral)  Resp 20  Wt 219 lb (99.338 kg) GEN- NAD, alert and oriented x3 HEENT- PERRL, EOMI, non injected sclera, pink conjunctiva, MMM, oropharynx clear CVS- RRR, no murmur RESP-CTAB ABD-NABS,soft,NT,ND, no CVA tenderness EXT- No edema Pulses- Radial, DP- 2+        Assessment & Plan:      Problem List Items Addressed This Visit    Obesity   Relevant Medications   metFORMIN (GLUCOPHAGE) 500 MG tablet   Hypertriglyceridemia   Relevant Medications   olmesartan (BENICAR) 20 MG tablet   Essential hypertension - Primary    Uncontrolled, restart benicar 10mg  in addition to her verapamil at bedtime Discussed healthy eating, exercise, weight loss       Relevant Medications   olmesartan (BENICAR) 20 MG tablet   Diabetes mellitus type II, uncontrolled (HCC)    New diagnosis of DM, A1C 8.5%,  check renal function today TG elevated in setting of DM Start MTF 500mg  BID per instructions Test CBG daily       Relevant Medications   metFORMIN (GLUCOPHAGE) 500 MG tablet   olmesartan (BENICAR) 20 MG tablet   Other Relevant Orders   Comprehensive metabolic panel   CBC with Differential/Platelet   Glucose, fingerstick (stat)      Note: This dictation was prepared with Dragon dictation along with smaller phrase technology. Any transcriptional errors that result from this process are unintentional.

## 2015-07-07 NOTE — Patient Instructions (Addendum)
Check your blood sugar once a day  Start metformin 500mg  once a day at breakfast for 3 days, then twice a day with breakfast and dinner  Restart benicar 10mg  in the morning  Continue fish oil twice a day  Fasting 80-120  After meal < 180  F/U 4 weeks Type 2 Diabetes Mellitus, Adult Type 2 diabetes mellitus is a long-term (chronic) disease. In type 2 diabetes:  The pancreas does not make enough of a hormone called insulin.  The cells in the body do not respond as well to the insulin that is made.  Both of the above can happen. Normally, insulin moves sugars from food into tissue cells. This gives you energy. If you have type 2 diabetes, sugars cannot be moved into tissue cells. This causes high blood sugar (hyperglycemia).  Your doctors will set personal treatment goals for you based on your age, your medicines, how long you have had diabetes, and any other medical conditions you have. Generally, the goal of treatment is to maintain the following blood glucose levels:  Before meals (preprandial): 80-130 mg/dL.  After meals (postprandial): below 180 mg/dL.  A1c: less than 6.5-7%. HOME CARE  Have your hemoglobin A1c level checked twice a year. The level shows if your diabetes is under control or out of control.  Test your blood sugar level every day as told by your doctor.  Check your ketone levels by testing your pee (urine) when you are sick and as told.  Take your diabetes or insulin medicine as told by your doctor.  Never run out of insulin.  Adjust how much insulin you give yourself based on how many carbs (carbohydrates) you eat. Carbs are in many foods, such as fruits, vegetables, whole grains, and dairy products.  Have a healthy snack between every healthy meal. Have 3 meals and 3 snacks a day.  Lose weight if you are overweight.  Carry a medical alert card or wear your medical alert jewelry.  Carry a 15-gram carb snack with you at all times. Examples  include:  Glucose pills, 3 or 4.  Glucose gel, 15-gram tube.  Raisins, 2 tablespoons (24 grams).  Jelly beans, 6.  Animal crackers, 8.  Regular (not diet) pop, 4 ounces (120 milliliters).  Gummy treats, 9.  Notice low blood sugar (hypoglycemia) symptoms, such as:  Shaking (tremors).  Trouble thinking clearly.  Sweating.  Faster heart rate.  Headache.  Dry mouth.  Hunger.  Crabbiness (irritability).  Being worried or tense (anxious).  Restless sleep.  A change in speech or coordination.  Confusion.  Treat low blood sugar right away. If you are alert and can swallow, follow the 15:15 rule:  Take 15-20 grams of a rapid-acting glucose or carb. This includes glucose gel, glucose pills, or 4 ounces (120 milliliters) of fruit juice, regular pop, or low-fat milk.  Check your blood sugar level 15 minutes after taking the glucose.  Take 15-20 grams more of glucose if the repeat blood sugar level is still 70 mg/dL (milligrams/deciliter) or below.  Eat a meal or snack within 1 hour of the blood sugar levels going back to normal.  Notice early symptoms of high blood sugar, such as:  Being really thirsty or drinking a lot (polydipsia).  Peeing a lot (polyuria).  Do at least 150 minutes of physical activity a week or as told.  Split the 150 minutes of activity up during the week. Do not do 150 minutes of activity in one day.  Perform exercises, such  as weight lifting, at least 2 times a week or as told.  Spend no more than 90 minutes at one time inactive.  Adjust your insulin or food intake as needed if you start a new exercise or sport.  Follow your sick-day plan when you are not able to eat or drink as usual.  Do not smoke, chew tobacco, or use electronic cigarettes.  Women who are not pregnant should drink no more than 1 drink a day. Men should drink no more than 2 drinks a day.  Only drink alcohol with food.  Ask your doctor if alcohol is safe for  you.  Tell your doctor if you drink alcohol several times during the week.  See your doctor regularly.  Schedule an eye exam soon after you are told you have diabetes. Schedule exams once every year.  Check your skin and feet every day. Check for cuts, bruises, redness, nail problems, bleeding, blisters, or sores. A doctor should do a foot exam once a year.  Brush your teeth and gums twice a day. Floss once a day. Visit your dentist regularly.  Share your diabetes plan with your workplace or school.  Keep your shots that fight diseases (vaccines) up to date.  Get a flu (influenza) shot every year.  Get a pneumonia shot. If you are 15 years of age or older and you have never gotten a pneumonia shot, you might need to get two shots.  Ask your doctor which other shots you should get.  Learn how to deal with stress.  Get diabetes education and support as needed.  Ask your doctor for special help if:  You need help to maintain or improve how you do things on your own.  You need help to maintain or improve the quality of your life.  You have foot or hand problems.  You have trouble cleaning yourself, dressing, eating, or doing physical activity. GET HELP IF:  You are unable to eat or drink for more than 6 hours.  You feel sick to your stomach (nauseous) or throw up (vomit) for more than 6 hours.  Your blood sugar level is over 240 mg/dL.  There is a change in mental status.  You get another serious illness.  You have watery poop (diarrhea) for more than 6 hours.  You have been sick or have had a fever for 2 or more days and are not getting better.  You have pain when you are active. GET HELP RIGHT AWAY IF:  You have trouble breathing.  Your ketone levels are higher than your doctor says they should be. MAKE SURE YOU:  Understand these instructions.  Will watch your condition.  Will get help right away if you are not doing well or get worse.   This  information is not intended to replace advice given to you by your health care provider. Make sure you discuss any questions you have with your health care provider.   Document Released: 02/03/2008 Document Revised: 09/10/2014 Document Reviewed: 11/26/2011 Elsevier Interactive Patient Education [employer].

## 2015-07-14 ENCOUNTER — Ambulatory Visit: Payer: Self-pay | Admitting: Family Medicine

## 2015-08-04 ENCOUNTER — Ambulatory Visit (INDEPENDENT_AMBULATORY_CARE_PROVIDER_SITE_OTHER): Payer: Managed Care, Other (non HMO) | Admitting: Family Medicine

## 2015-08-04 ENCOUNTER — Encounter: Payer: Self-pay | Admitting: Family Medicine

## 2015-08-04 VITALS — BP 140/82 | HR 88 | Temp 98.2°F | Resp 14 | Ht 65.0 in | Wt 214.0 lb

## 2015-08-04 DIAGNOSIS — E1165 Type 2 diabetes mellitus with hyperglycemia: Secondary | ICD-10-CM | POA: Diagnosis not present

## 2015-08-04 DIAGNOSIS — I10 Essential (primary) hypertension: Secondary | ICD-10-CM | POA: Diagnosis not present

## 2015-08-04 DIAGNOSIS — IMO0001 Reserved for inherently not codable concepts without codable children: Secondary | ICD-10-CM

## 2015-08-04 NOTE — Assessment & Plan Note (Signed)
Blood pressure today is improved but still a little elevated. She is on 10 mg of Benicar she will continue to monitor at home her blood pressures seem to be much better at home. If need be we will bump her up to 20 mg of Benicar

## 2015-08-04 NOTE — Progress Notes (Signed)
Patient ID: Leslie May, female   DOB: 04-24-1961, 55 y.o.   MRN: [id number]   Subjective:    Patient ID: Leslie May, female    DOB: 04-Jul-1960, 55 y.o.   MRN: [id number]  Patient presents for F/U Patient here for interim follow-up. We reviewed her last set of labs she was seen by her gynecologist she showed an elevated blood sugar and A1c of 8.5% her blood sugar in the office was 230 her renal function was preserved liver function was normal. I started her on metformin 500 mg twice a day her blood sugars on average are 117-150. She did have 2 outliers 1 6177 the night before she had significant carbs including bread [place] french fries. Reiterated the importance of dietary adherence with her diabetes mellitus. She has lost 5 pounds since her last visit 4 weeks ago. She is also back on Benicar 10 mg once a day for her blood pressure blood pressure check at home was 128/68 she has no new concerns today initially had diarrhea with metformin that is now resolved. She is trying to walk a few times a week she is also doing CrossFit 2 days a week.  Tobacco she is now on the 14 mg nicotine patch    Review Of Systems:  GEN- denies fatigue, fever, weight loss,weakness, recent illness HEENT- denies eye drainage, change in vision, nasal discharge, CVS- denies chest pain, palpitations RESP- denies SOB, cough, wheeze ABD- denies N/V, change in stools, abd pain GU- denies dysuria, hematuria, dribbling, incontinence MSK- denies joint pain, muscle aches, injury Neuro- denies headache, dizziness, syncope, seizure activity       Objective:    BP 140/82 mmHg  Pulse 88  Temp(Src) 98.2 F (36.8 C) (Oral)  Resp 14  Ht 5\' 5"  (1.651 m)  Wt 214 lb (97.07 kg)  BMI 35.61 kg/m2 GEN- NAD, alert and oriented x3        Assessment & Plan:      Problem List Items Addressed This Visit    Essential hypertension    Blood pressure today is improved but still a little elevated. She is on 10 mg of  Benicar she will continue to monitor at home her blood pressures seem to be much better at home. If need be we will bump her up to 20 mg of Benicar      Diabetes mellitus type II, uncontrolled ([place]) - Primary    New onset diabetes she is tolerating metformin. Her fasting blood sugars continue to improve 500 mg twice a day. Reiterated the importance of the low-carb diet. She is trying to follow an Atkins type diet. She is also exercising and has lost 5 pounds. We will follow-up in May as scheduled repeat her A1c. No change to current dose of medications. She is on aspirin.         Note: This dictation was prepared with Dragon dictation along with smaller phrase technology. Any transcriptional errors that result from this process are unintentional.

## 2015-08-04 NOTE — Assessment & Plan Note (Signed)
New onset diabetes she is tolerating metformin. Her fasting blood sugars continue to improve 500 mg twice a day. Reiterated the importance of the low-carb diet. She is trying to follow an Atkins type diet. She is also exercising and has lost 5 pounds. We will follow-up in May as scheduled repeat her A1c. No change to current dose of medications. She is on aspirin.

## 2015-08-04 NOTE — Patient Instructions (Addendum)
Continue with metformin 500mg  twice a day  Low carb diet  CHeck blood pressure three times a week  Keep F/U in May- come fasting

## 2015-08-21 ENCOUNTER — Other Ambulatory Visit: Payer: Self-pay | Admitting: *Deleted

## 2015-08-21 MED ORDER — METFORMIN HCL 500 MG PO TABS: 500.0000 mg | ORAL_TABLET | Freq: Two times a day (BID) | ORAL | Status: DC

## 2015-08-21 MED ORDER — OMEPRAZOLE 20 MG PO CPDR: 20.0000 mg | DELAYED_RELEASE_CAPSULE | Freq: Every day | ORAL | Status: DC

## 2015-08-21 MED ORDER — OLMESARTAN MEDOXOMIL 20 MG PO TABS: 10.0000 mg | ORAL_TABLET | Freq: Every day | ORAL | Status: DC

## 2015-08-21 MED ORDER — ONETOUCH ULTRA BLUE VI STRP: ORAL_STRIP | Status: DC

## 2015-08-21 MED ORDER — VERAPAMIL HCL ER 300 MG PO CP24: 1.0000 | ORAL_CAPSULE | Freq: Every day | ORAL | Status: DC

## 2015-08-21 NOTE — Telephone Encounter (Signed)
Received fax requesting refill on Benicar, MTF, Prilosec, Verapamil, and test strips with 90 day supply.   Refill appropriate and filled per protocol.

## 2015-09-29 ENCOUNTER — Ambulatory Visit (INDEPENDENT_AMBULATORY_CARE_PROVIDER_SITE_OTHER): Payer: Managed Care, Other (non HMO) | Admitting: Family Medicine

## 2015-09-29 ENCOUNTER — Encounter: Payer: Self-pay | Admitting: Family Medicine

## 2015-09-29 VITALS — BP 132/78 | HR 80 | Temp 98.9°F | Resp 16 | Ht 65.0 in | Wt 201.0 lb

## 2015-09-29 DIAGNOSIS — Z1159 Encounter for screening for other viral diseases: Secondary | ICD-10-CM

## 2015-09-29 DIAGNOSIS — E559 Vitamin D deficiency, unspecified: Secondary | ICD-10-CM | POA: Diagnosis not present

## 2015-09-29 DIAGNOSIS — E669 Obesity, unspecified: Secondary | ICD-10-CM

## 2015-09-29 DIAGNOSIS — I10 Essential (primary) hypertension: Secondary | ICD-10-CM | POA: Diagnosis not present

## 2015-09-29 DIAGNOSIS — E1165 Type 2 diabetes mellitus with hyperglycemia: Secondary | ICD-10-CM | POA: Diagnosis not present

## 2015-09-29 DIAGNOSIS — E781 Pure hyperglyceridemia: Secondary | ICD-10-CM | POA: Diagnosis not present

## 2015-09-29 DIAGNOSIS — IMO0001 Reserved for inherently not codable concepts without codable children: Secondary | ICD-10-CM

## 2015-09-29 LAB — LIPID PANEL
CHOLESTEROL: 163 mg/dL (ref 125–200)
HDL: 27 mg/dL — ABNORMAL LOW (ref >=46)
LDL Cholesterol: 92 mg/dL (ref <130)
TRIGLYCERIDES: 220 mg/dL — AB (ref <150)
Total CHOL/HDL Ratio: 6 Ratio — ABNORMAL HIGH (ref <=5.0)
VLDL: 44 mg/dL — AB (ref <30)

## 2015-09-29 LAB — COMPREHENSIVE METABOLIC PANEL
ALBUMIN: 4.1 g/dL (ref 3.6–5.1)
ALK PHOS: 65 U/L (ref 33–130)
ALT: 16 U/L (ref 6–29)
AST: 15 U/L (ref 10–35)
BUN: 15 mg/dL (ref 7–25)
CALCIUM: 9.5 mg/dL (ref 8.6–10.4)
CO2: 26 mmol/L (ref 20–31)
Chloride: 105 mmol/L (ref 98–110)
Creat: 0.79 mg/dL (ref 0.50–1.05)
GLUCOSE: 97 mg/dL (ref 70–99)
POTASSIUM: 4.5 mmol/L (ref 3.5–5.3)
Sodium: 142 mmol/L (ref 135–146)
Total Bilirubin: 0.4 mg/dL (ref 0.2–1.2)
Total Protein: 7 g/dL (ref 6.1–8.1)

## 2015-09-29 LAB — CBC WITH DIFFERENTIAL/PLATELET
Basophils Absolute: 0 cells/uL (ref 0–200)
Basophils Relative: 0 %
EOS ABS: 206 {cells}/uL (ref 15–500)
Eosinophils Relative: 2 %
HEMATOCRIT: 47.7 % — AB (ref 35.0–45.0)
HEMOGLOBIN: 16 g/dL — AB (ref 12.0–15.0)
LYMPHS PCT: 35 %
Lymphs Abs: 3605 cells/uL (ref 850–3900)
MCH: 29.3 pg (ref 27.0–33.0)
MCHC: 33.5 g/dL (ref 32.0–36.0)
MCV: 87.2 fL (ref 80.0–100.0)
MONO ABS: 515 {cells}/uL (ref 200–950)
MPV: 10.5 fL (ref 7.5–12.5)
Monocytes Relative: 5 %
NEUTROS PCT: 58 %
Neutro Abs: 5974 cells/uL (ref 1500–7800)
Platelets: 294 10*3/uL (ref 140–400)
RBC: 5.47 MIL/uL — ABNORMAL HIGH (ref 3.80–5.10)
RDW: 13.8 % (ref 11.0–15.0)
WBC: 10.3 10*3/uL (ref 3.8–10.8)

## 2015-09-29 MED ORDER — OMEPRAZOLE 20 MG PO CPDR: 20.0000 mg | DELAYED_RELEASE_CAPSULE | Freq: Every day | ORAL | Status: DC

## 2015-09-29 NOTE — Patient Instructions (Signed)
F/U  3 months 

## 2015-09-29 NOTE — Assessment & Plan Note (Signed)
Recheck A1C, intentional weight loss, will help resolve the diabetes quicker for now continue metformin

## 2015-09-29 NOTE — Assessment & Plan Note (Signed)
Blood pressure well controlled no change to benicar

## 2015-09-29 NOTE — Assessment & Plan Note (Signed)
Recheck levels on fish oil and low fat diet

## 2015-09-29 NOTE — Progress Notes (Signed)
Patient ID: Leslie May, female   DOB: 1960/05/11, 55 y.o.   MRN: [id number]    Subjective:    Patient ID: Leslie May, female    DOB: 04-07-1961, 55 y.o.   MRN: [id number]  Patient presents for 6 month F/U Patient is here for diabetes mellitus follow-up. She is diagnosed with new onset diabetes mellitus back in March after she was seen by her GYN. She is on metformin 500 mg twice a day and low-carb diet. She is also working on weight loss and exercise to help improve her diabetes.  CBG run- 90-130, she did have 1 hypoglycemic episode, down to 89 where she was symptomatic, she ate a cookie and it resolved  She has intentionally lost 15lbs  She is currently on Benicar for her blood pressure She is due for repeat fasting labs today  Vitamin D def- completed 12 weeks of ergocalciferol,due for repeat level  Review Of Systems:  GEN- denies fatigue, fever, weight loss,weakness, recent illness HEENT- denies eye drainage, change in vision, nasal discharge, CVS- denies chest pain, palpitations RESP- denies SOB, cough, wheeze ABD- denies N/V, change in stools, abd pain GU- denies dysuria, hematuria, dribbling, incontinence MSK- denies joint pain, muscle aches, injury Neuro- denies headache, dizziness, syncope, seizure activity       Objective:    BP 132/78 mmHg  Pulse 80  Temp(Src) 98.9 F (37.2 C) (Oral)  Resp 16  Ht 5\' 5"  (1.651 m)  Wt 201 lb (91.173 kg)  BMI 33.45 kg/m2 GEN- NAD, alert and oriented x3 HEENT- PERRL, EOMI, non injected sclera, pink conjunctiva, MMM, oropharynx clear Neck- Supple, no thyromegaly CVS- RRR, soft systolic murmur heard beast RSB RESP-CTAB EXT- No edema Pulses- Radial, DP- 2+        Assessment & Plan:      Problem List Items Addressed This Visit    Obesity   Hypertriglyceridemia    Recheck levels on fish oil and low fat diet      Relevant Orders   Lipid panel   Essential hypertension - Primary    Blood pressure well controlled no  change to benicar      Relevant Orders   CBC with Differential/Platelet   Comprehensive metabolic panel   Diabetes mellitus type II, uncontrolled ([place])    Recheck A1C, intentional weight loss, will help resolve the diabetes quicker for now continue metformin      Relevant Orders   Hemoglobin A1c   Microalbumin / creatinine urine ratio   HM DIABETES FOOT EXAM (Completed)    Other Visit Diagnoses    Vitamin D deficiency        Relevant Orders    Vitamin D, 25-hydroxy    Need for hepatitis C screening test        Relevant Orders    Hepatitis C Antibody       Note: This dictation was prepared with Dragon dictation along with smaller phrase technology. Any transcriptional errors that result from this process are unintentional.

## 2015-09-30 LAB — MICROALBUMIN / CREATININE URINE RATIO
Creatinine, Urine: 24 mg/dL (ref 20–320)
MICROALB UR: 0.3 mg/dL
MICROALB/CREAT RATIO: 13 ug/mg{creat} (ref <30)

## 2015-09-30 LAB — VITAMIN D 25 HYDROXY (VIT D DEFICIENCY, FRACTURES): Vit D, 25-Hydroxy: 39 ng/mL (ref 30–100)

## 2015-09-30 LAB — HEMOGLOBIN A1C
Hgb A1c MFr Bld: 6 % — ABNORMAL HIGH (ref <5.7)
MEAN PLASMA GLUCOSE: 126 mg/dL

## 2015-09-30 LAB — HEPATITIS C ANTIBODY: HCV AB: NEGATIVE

## 2015-12-31 ENCOUNTER — Ambulatory Visit: Payer: Managed Care, Other (non HMO) | Admitting: Family Medicine

## 2016-01-16 ENCOUNTER — Encounter: Payer: Self-pay | Admitting: Family Medicine

## 2016-01-16 ENCOUNTER — Ambulatory Visit (INDEPENDENT_AMBULATORY_CARE_PROVIDER_SITE_OTHER): Payer: Managed Care, Other (non HMO) | Admitting: Family Medicine

## 2016-01-16 VITALS — BP 122/68 | HR 66 | Temp 98.1°F | Resp 16 | Ht 65.0 in | Wt 197.0 lb

## 2016-01-16 DIAGNOSIS — Z720 Tobacco use: Secondary | ICD-10-CM | POA: Diagnosis not present

## 2016-01-16 DIAGNOSIS — E119 Type 2 diabetes mellitus without complications: Secondary | ICD-10-CM

## 2016-01-16 DIAGNOSIS — E669 Obesity, unspecified: Secondary | ICD-10-CM | POA: Diagnosis not present

## 2016-01-16 DIAGNOSIS — E559 Vitamin D deficiency, unspecified: Secondary | ICD-10-CM

## 2016-01-16 DIAGNOSIS — E1165 Type 2 diabetes mellitus with hyperglycemia: Secondary | ICD-10-CM | POA: Diagnosis not present

## 2016-01-16 DIAGNOSIS — Z23 Encounter for immunization: Secondary | ICD-10-CM | POA: Diagnosis not present

## 2016-01-16 DIAGNOSIS — I10 Essential (primary) hypertension: Secondary | ICD-10-CM | POA: Diagnosis not present

## 2016-01-16 DIAGNOSIS — IMO0001 Reserved for inherently not codable concepts without codable children: Secondary | ICD-10-CM

## 2016-01-16 DIAGNOSIS — L84 Corns and callosities: Secondary | ICD-10-CM

## 2016-01-16 LAB — COMPREHENSIVE METABOLIC PANEL
ALBUMIN: 4.3 g/dL (ref 3.6–5.1)
ALK PHOS: 54 U/L (ref 33–130)
ALT: 14 U/L (ref 6–29)
AST: 14 U/L (ref 10–35)
BILIRUBIN TOTAL: 0.3 mg/dL (ref 0.2–1.2)
BUN: 16 mg/dL (ref 7–25)
CALCIUM: 9.6 mg/dL (ref 8.6–10.4)
CO2: 25 mmol/L (ref 20–31)
Chloride: 105 mmol/L (ref 98–110)
Creat: 0.72 mg/dL (ref 0.50–1.05)
Glucose, Bld: 79 mg/dL (ref 70–99)
POTASSIUM: 4.5 mmol/L (ref 3.5–5.3)
Sodium: 141 mmol/L (ref 135–146)
TOTAL PROTEIN: 7.1 g/dL (ref 6.1–8.1)

## 2016-01-16 LAB — CBC WITH DIFFERENTIAL/PLATELET
BASOS PCT: 0 %
Basophils Absolute: 0 cells/uL (ref 0–200)
EOS ABS: 103 {cells}/uL (ref 15–500)
Eosinophils Relative: 1 %
HEMATOCRIT: 46.1 % — AB (ref 35.0–45.0)
HEMOGLOBIN: 15.9 g/dL — AB (ref 12.0–15.0)
LYMPHS ABS: 4223 {cells}/uL — AB (ref 850–3900)
LYMPHS PCT: 41 %
MCH: 30.3 pg (ref 27.0–33.0)
MCHC: 34.5 g/dL (ref 32.0–36.0)
MCV: 88 fL (ref 80.0–100.0)
MONO ABS: 618 {cells}/uL (ref 200–950)
MPV: 10.9 fL (ref 7.5–12.5)
Monocytes Relative: 6 %
Neutro Abs: 5356 cells/uL (ref 1500–7800)
Neutrophils Relative %: 52 %
Platelets: 278 10*3/uL (ref 140–400)
RBC: 5.24 MIL/uL — ABNORMAL HIGH (ref 3.80–5.10)
RDW: 13.3 % (ref 11.0–15.0)
WBC: 10.3 10*3/uL (ref 3.8–10.8)

## 2016-01-16 NOTE — Progress Notes (Signed)
   Subjective:    Patient ID: Leslie May, female    DOB: Nov 06, 1960, 55 y.o.   MRN: [id number]  Patient presents for Follow-up (3 months- is not fasting )  Pt here for f/u DM- last A1C 6%, weight down 5lbs, she is trying to watch her diet, she did have cheat days after sister died who had chronic illness back in July but now back on track. CBG run 90-115 fasting    Has new grandson, needs TDAP   Labs reviwed   tobacco- down to 1/2ppd     Review Of Systems:  GEN- denies fatigue, fever, weight loss,weakness, recent illness HEENT- denies eye drainage, change in vision, nasal discharge, CVS- denies chest pain, palpitations RESP- denies SOB, cough, wheeze ABD- denies N/V, change in stools, abd pain GU- denies dysuria, hematuria, dribbling, incontinence MSK- denies joint pain, muscle aches, injury Neuro- denies headache, dizziness, syncope, seizure activity       Objective:    BP 122/68 (BP Location: Left Arm, Patient Position: Sitting, Cuff Size: Normal)   Pulse 66   Temp 98.1 F (36.7 C) (Oral)   Resp 16   Ht 5\' 5"  (1.651 m)   Wt 197 lb (89.4 kg)   BMI 32.78 kg/m  GEN- NAD, alert and oriented x3 HEENT- PERRL, EOMI, non injected sclera, pink conjunctiva, MMM, oropharynx clear Neck- Supple, no thyromegaly CVS- RRR, no murmur RESP-CTAB ABD-NABS,soft,NT,ND EXT- No edema, left sole small callus TTP  Pulses- Radial, DP- 2+        Assessment & Plan:      Problem List Items Addressed This Visit    Tobacco use - Primary    Continue to cut back on tobacco use       Obesity    Continue to work on low carb, healthy fats and protein       Essential hypertension    Well controlled Encourage more aerobic exercise       Relevant Orders   CBC with Differential/Platelet (Completed)   Comprehensive metabolic panel (Completed)   Diabetes mellitus type II, uncontrolled ([place])    Recheck fasting labs, may be able to decrease MTF to daily as she continues to lose  weight       Relevant Orders   Hemoglobin A1c (Completed)    Other Visit Diagnoses    Vitamin D deficiency       Relevant Orders   Vitamin D, 25-hydroxy (Completed)   Need for prophylactic vaccination with combined diphtheria-tetanus-pertussis (DTP) vaccine       Relevant Orders   Tdap vaccine greater than or equal to 7yo IM (Completed)   Callus of foot       discussed podiatry pt declines will try corn callus pad to help with pressure      Note: This dictation was prepared with Dragon dictation along with smaller phrase technology. Any transcriptional errors that result from this process are unintentional.

## 2016-01-16 NOTE — Patient Instructions (Addendum)
F/U 4 months  User corn callus pad  Schedule nurse visit for flu shot

## 2016-01-17 LAB — HEMOGLOBIN A1C
Hgb A1c MFr Bld: 5.2 % (ref <5.7)
Mean Plasma Glucose: 103 mg/dL

## 2016-01-17 LAB — VITAMIN D 25 HYDROXY (VIT D DEFICIENCY, FRACTURES): Vit D, 25-Hydroxy: 32 ng/mL (ref 30–100)

## 2016-01-17 NOTE — Assessment & Plan Note (Signed)
Continue to work on low carb, healthy fats and protein

## 2016-01-17 NOTE — Assessment & Plan Note (Signed)
Recheck fasting labs, may be able to decrease MTF to daily as she continues to lose weight

## 2016-01-17 NOTE — Assessment & Plan Note (Signed)
Well controlled Encourage more aerobic exercise

## 2016-01-17 NOTE — Assessment & Plan Note (Signed)
Continue to cut back on tobacco use

## 2016-04-20 ENCOUNTER — Ambulatory Visit: Payer: Managed Care, Other (non HMO) | Admitting: Family Medicine

## 2016-05-19 ENCOUNTER — Ambulatory Visit (INDEPENDENT_AMBULATORY_CARE_PROVIDER_SITE_OTHER): Payer: Managed Care, Other (non HMO) | Admitting: Family Medicine

## 2016-05-19 ENCOUNTER — Encounter: Payer: Self-pay | Admitting: Family Medicine

## 2016-05-19 VITALS — BP 128/72 | HR 84 | Temp 98.9°F | Resp 14 | Ht 65.0 in | Wt 195.0 lb

## 2016-05-19 DIAGNOSIS — E119 Type 2 diabetes mellitus without complications: Secondary | ICD-10-CM | POA: Diagnosis not present

## 2016-05-19 DIAGNOSIS — B372 Candidiasis of skin and nail: Secondary | ICD-10-CM

## 2016-05-19 DIAGNOSIS — Z720 Tobacco use: Secondary | ICD-10-CM

## 2016-05-19 DIAGNOSIS — I10 Essential (primary) hypertension: Secondary | ICD-10-CM

## 2016-05-19 DIAGNOSIS — E6609 Other obesity due to excess calories: Secondary | ICD-10-CM

## 2016-05-19 DIAGNOSIS — Z6832 Body mass index (BMI) 32.0-32.9, adult: Secondary | ICD-10-CM

## 2016-05-19 MED ORDER — NYSTATIN POWD: 3 refills | Status: DC

## 2016-05-19 MED ORDER — BUPROPION HCL ER (SR) 150 MG PO TB12: 150.0000 mg | ORAL_TABLET | Freq: Two times a day (BID) | ORAL | 2 refills | Status: DC

## 2016-05-19 NOTE — Progress Notes (Signed)
   Subjective:    Patient ID: Leslie May, female    DOB: 15-Aug-1960, 56 y.o.   MRN: [id number]  Patient presents for 3 month F/U (is not fasting) Patient follow-up occasions. She is currently on metformin 500 mg once a day her last A1c was 5.2% she has been trying to monitor her diet she does been doing portion control. She states that she decreased down to 182 right before the holidays but is gaining this back. She has not been exercising either.  She has had some episodes of yeast growth beneath her fold states that she's had this in the past she had some leftover antifungal powder but is running out of this led to have this refilled.  Flu shot she declines    Review Of Systems:  GEN- denies fatigue, fever, weight loss,weakness, recent illness HEENT- denies eye drainage, change in vision, nasal discharge, CVS- denies chest pain, palpitations RESP- denies SOB, cough, wheeze ABD- denies N/V, change in stools, abd pain GU- denies dysuria, hematuria, dribbling, incontinence MSK- denies joint pain, muscle aches, injury Neuro- denies headache, dizziness, syncope, seizure activity       Objective:    BP 128/72 (BP Location: Left Arm, Patient Position: Sitting, Cuff Size: Normal)   Pulse 84   Temp 98.9 F (37.2 C) (Oral)   Resp 14   Ht 5\' 5"  (1.651 m)   Wt 195 lb (88.5 kg)   SpO2 98%   BMI 32.45 kg/m  GEN- NAD, alert and oriented x3 HEENT- PERRL, EOMI, non injected sclera, pink conjunctiva, MMM, oropharynx clear CVS- RRR, no murmur RESP-CTAB EXT- No edema Pulses- Radial 2+        Assessment & Plan:      Problem List Items Addressed This Visit    Tobacco use    Discussed tobacco cessation. She is willing to try Zyban      Obesity   Essential hypertension - Primary    Blood pressures well controlled to change her medication. Discussed her eating habits and lack of exercise. Short-term goals up and set. We'll recheck her A1c today will like to discontinue the  metformin. Her A1c is below 6%      Relevant Orders   CBC with Differential/Platelet (Completed)   Comprehensive metabolic panel (Completed)   Diabetes mellitus ([place])    Per above only on metformin and aspirin and Benicar      Relevant Orders   Hemoglobin A1c (Completed)    Other Visit Diagnoses    Intertriginous candidiasis       We will give her nystatin powder      Note: This dictation was prepared with Dragon dictation along with smaller phrase technology. Any transcriptional errors that result from this process are unintentional.

## 2016-05-19 NOTE — Patient Instructions (Signed)
Nystatin powder Use the wellbutrin for smoking  F/U 6 months

## 2016-05-20 ENCOUNTER — Encounter: Payer: Self-pay | Admitting: Family Medicine

## 2016-05-20 LAB — COMPREHENSIVE METABOLIC PANEL
ALK PHOS: 56 U/L (ref 33–130)
ALT: 11 U/L (ref 6–29)
AST: 10 U/L (ref 10–35)
Albumin: 4.2 g/dL (ref 3.6–5.1)
BILIRUBIN TOTAL: 0.3 mg/dL (ref 0.2–1.2)
BUN: 15 mg/dL (ref 7–25)
CALCIUM: 9.6 mg/dL (ref 8.6–10.4)
CO2: 26 mmol/L (ref 20–31)
CREATININE: 0.73 mg/dL (ref 0.50–1.05)
Chloride: 105 mmol/L (ref 98–110)
GLUCOSE: 109 mg/dL — AB (ref 70–99)
Potassium: 3.8 mmol/L (ref 3.5–5.3)
SODIUM: 139 mmol/L (ref 135–146)
Total Protein: 6.9 g/dL (ref 6.1–8.1)

## 2016-05-20 LAB — CBC WITH DIFFERENTIAL/PLATELET
BASOS PCT: 0 %
Basophils Absolute: 0 cells/uL (ref 0–200)
EOS PCT: 2 %
Eosinophils Absolute: 234 cells/uL (ref 15–500)
HCT: 45.7 % — ABNORMAL HIGH (ref 35.0–45.0)
HEMOGLOBIN: 15.4 g/dL — AB (ref 12.0–15.0)
LYMPHS ABS: 4680 {cells}/uL — AB (ref 850–3900)
LYMPHS PCT: 40 %
MCH: 30.1 pg (ref 27.0–33.0)
MCHC: 33.7 g/dL (ref 32.0–36.0)
MCV: 89.3 fL (ref 80.0–100.0)
MONO ABS: 702 {cells}/uL (ref 200–950)
MPV: 10.1 fL (ref 7.5–12.5)
Monocytes Relative: 6 %
Neutro Abs: 6084 cells/uL (ref 1500–7800)
Neutrophils Relative %: 52 %
Platelets: 285 10*3/uL (ref 140–400)
RBC: 5.12 MIL/uL — ABNORMAL HIGH (ref 3.80–5.10)
RDW: 12.9 % (ref 11.0–15.0)
WBC: 11.7 10*3/uL — AB (ref 3.8–10.8)

## 2016-05-20 LAB — HEMOGLOBIN A1C
Hgb A1c MFr Bld: 5 % (ref <5.7)
Mean Plasma Glucose: 97 mg/dL

## 2016-05-20 MED ORDER — OLMESARTAN MEDOXOMIL 20 MG PO TABS: 10.0000 mg | ORAL_TABLET | Freq: Every day | ORAL | 3 refills | Status: DC

## 2016-05-20 MED ORDER — OMEPRAZOLE 20 MG PO CPDR
20.0000 mg | DELAYED_RELEASE_CAPSULE | Freq: Every day | ORAL | 3 refills | Status: DC
Start: 2016-05-20 — End: 2016-08-30

## 2016-05-20 NOTE — Assessment & Plan Note (Signed)
Blood pressures well controlled to change her medication. Discussed her eating habits and lack of exercise. Short-term goals up and set. We'll recheck her A1c today will like to discontinue the metformin. Her A1c is below 6%

## 2016-05-20 NOTE — Assessment & Plan Note (Signed)
Per above only on metformin and aspirin and Benicar

## 2016-05-20 NOTE — Assessment & Plan Note (Addendum)
Discussed tobacco cessation. She is willing to try Zyban

## 2016-05-21 ENCOUNTER — Other Ambulatory Visit: Payer: Self-pay | Admitting: *Deleted

## 2016-06-25 ENCOUNTER — Encounter: Payer: Self-pay | Admitting: Family Medicine

## 2016-06-25 ENCOUNTER — Ambulatory Visit (INDEPENDENT_AMBULATORY_CARE_PROVIDER_SITE_OTHER): Payer: Managed Care, Other (non HMO) | Admitting: Family Medicine

## 2016-06-25 VITALS — BP 130/80 | HR 80 | Temp 98.1°F | Resp 18 | Ht 65.0 in | Wt 194.0 lb

## 2016-06-25 DIAGNOSIS — J029 Acute pharyngitis, unspecified: Secondary | ICD-10-CM

## 2016-06-25 LAB — STREP GROUP A AG, W/REFLEX TO CULT: STREGTOCOCCUS GROUP A AG SCREEN: NOT DETECTED

## 2016-06-25 MED ORDER — AZITHROMYCIN 250 MG PO TABS
ORAL_TABLET | ORAL | 0 refills | Status: DC
Start: 2016-06-25 — End: 2016-11-24

## 2016-06-25 NOTE — Progress Notes (Signed)
Subjective:    Patient ID: Leslie May, female    DOB: Nov 15, 1960, 56 y.o.   MRN: [id number]  HPI Patient is a very pleasant 56 year old Leslie May female here today complaining of a sore throat for less than 8 hours in duration. However her grandson was diagnosed with strep throat yesterday. Several members of her family have strep throat. She awoke this morning at 1 AM with severe sore throat on the left side of her throat. She has tender lymphadenopathy on the left side of throat. There is some mild erythema in the posterior oropharynx although there is no visible exudate. She reports subjective fevers. She denies any rhinorrhea or cough or other symptoms of virus Past Medical History:  Diagnosis Date  . Cancer ([place]) > 20 years ago   cervical cancer s/p cryosurgery  . Diabetes mellitus without complication ([place])   . Hypertension    Past Surgical History:  Procedure Laterality Date  . arm surgery  2006   to repair pinched nerve in left elbow  . HYSTEROSCOPY W/D&C N/A 08/31/2013   Procedure: DILATATION AND CURETTAGE /HYSTEROSCOPY;  Surgeon: Marylynn Pearson, MD;  Location: [facility];  Service: Gynecology;  Laterality: N/A;  . TONSILLECTOMY    . TUBAL LIGATION     Current Outpatient Prescriptions on File Prior to Visit  Medication Sig Dispense Refill  . aspirin 81 MG tablet Take 81 mg by mouth daily.    Marland Kitchen buPROPion (WELLBUTRIN SR) 150 MG 12 hr tablet Take 1 tablet (150 mg total) by mouth 2 (two) times daily. 60 tablet 2  . cholecalciferol (VITAMIN D) 1000 units tablet Take 1,000 Units by mouth daily.    Marland Kitchen estradiol (CLIMARA - DOSED IN MG/24 HR) 0.05 mg/24hr patch Place 1 patch (0.05 mg total) onto the skin once a week. 4 patch 12  . Multiple Vitamin (MULTIVITAMIN WITH MINERALS) TABS tablet Take 1 tablet by mouth daily. Menopause Support formulation    . Nystatin POWD Apply to affected areas three times a day as needed 1 Bottle 3  . olmesartan (BENICAR) 20 MG tablet Take 0.5 tablets (10 mg  total) by mouth daily. 45 tablet 3  . Omega-3 Fatty Acids (FISH OIL) 1000 MG CAPS (1) cap PO BID (Patient taking differently: 2 capsules. (2) cap PO BID)  0  . omeprazole (PRILOSEC) 20 MG capsule Take 1 capsule (20 mg total) by mouth daily. 90 capsule 3  . ONE TOUCH ULTRA TEST test strip Use to monitor FSBS 1x daily. Dx: E11.9. 100 each 3  . ONETOUCH DELICA LANCETS 99991111 MISC     . progesterone (PROMETRIUM) 100 MG capsule     . Verapamil HCl CR 300 MG CP24 Take 1 capsule (300 mg total) by mouth daily. 90 capsule 3   No current facility-administered medications on file prior to visit.    Allergies  Allergen Reactions  . Hctz [Hydrochlorothiazide] Itching  . Norvasc [Amlodipine Besylate] Swelling    Throat swelling  . Penicillins Swelling    Throat swelling   Social History   Social History  . Marital status: Married    Spouse name: N/A  . Number of children: N/A  . Years of education: N/A   Occupational History  . Not on file.   Social History Main Topics  . Smoking status: Current Every Day Smoker    Packs/day: 2.00    Types: Cigarettes  . Smokeless tobacco: Never Used  . Alcohol use No  . Drug use: No  . Sexual activity:  Yes   Other Topics Concern  . Not on file   Social History Narrative  . No narrative on file      Review of Systems  All other systems reviewed and are negative.      Objective:   Physical Exam  HENT:  Right Ear: Tympanic membrane, external ear and ear canal normal.  Left Ear: Tympanic membrane, external ear and ear canal normal.  Nose: Nose normal.  Mouth/Throat: Posterior oropharyngeal erythema present. No oropharyngeal exudate.  Eyes: Conjunctivae are normal.  Cardiovascular: Normal rate, regular rhythm and normal heart sounds.   Pulmonary/Chest: Effort normal and breath sounds normal. No respiratory distress. She has no wheezes. She has no rales.  Vitals reviewed.         Assessment & Plan:  Sore throat - Plan: STREP GROUP A  AG, W/REFLEX TO CULT, azithromycin (ZITHROMAX) 250 MG tablet  Given her history, physical, and exposure, I will treat the patient for strep throat. 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 Therefore I will use azithromycin in the form of a Z-Pak for 5 days.

## 2016-06-27 LAB — CULTURE, GROUP A STREP

## 2016-07-05 ENCOUNTER — Other Ambulatory Visit: Payer: Self-pay | Admitting: Obstetrics and Gynecology

## 2016-07-05 DIAGNOSIS — R928 Other abnormal and inconclusive findings on diagnostic imaging of breast: Secondary | ICD-10-CM

## 2016-07-07 ENCOUNTER — Ambulatory Visit
Admission: RE | Admit: 2016-07-07 | Discharge: 2016-07-07 | Disposition: A | Discharge status: Home / Self Care | Payer: Managed Care, Other (non HMO) | Source: Ambulatory Visit | Attending: Obstetrics and Gynecology | Admitting: Obstetrics and Gynecology

## 2016-07-07 DIAGNOSIS — R928 Other abnormal and inconclusive findings on diagnostic imaging of breast: Secondary | ICD-10-CM

## 2016-08-30 ENCOUNTER — Other Ambulatory Visit: Payer: Self-pay | Admitting: *Deleted

## 2016-08-30 MED ORDER — VERAPAMIL HCL ER 300 MG PO CP24: 1.0000 | ORAL_CAPSULE | Freq: Every day | ORAL | 3 refills | Status: DC

## 2016-08-30 MED ORDER — OMEPRAZOLE 20 MG PO CPDR: 20.0000 mg | DELAYED_RELEASE_CAPSULE | Freq: Every day | ORAL | 3 refills | Status: DC

## 2016-11-17 ENCOUNTER — Ambulatory Visit: Payer: Managed Care, Other (non HMO) | Admitting: Family Medicine

## 2016-11-24 ENCOUNTER — Ambulatory Visit (INDEPENDENT_AMBULATORY_CARE_PROVIDER_SITE_OTHER): Payer: Managed Care, Other (non HMO) | Admitting: Family Medicine

## 2016-11-24 ENCOUNTER — Encounter: Payer: Self-pay | Admitting: Family Medicine

## 2016-11-24 VITALS — BP 138/84 | HR 84 | Temp 98.4°F | Resp 14 | Ht 65.0 in | Wt 202.0 lb

## 2016-11-24 DIAGNOSIS — E119 Type 2 diabetes mellitus without complications: Secondary | ICD-10-CM | POA: Diagnosis not present

## 2016-11-24 DIAGNOSIS — I10 Essential (primary) hypertension: Secondary | ICD-10-CM

## 2016-11-24 DIAGNOSIS — E781 Pure hyperglyceridemia: Secondary | ICD-10-CM

## 2016-11-24 DIAGNOSIS — Z720 Tobacco use: Secondary | ICD-10-CM

## 2016-11-24 DIAGNOSIS — Z6833 Body mass index (BMI) 33.0-33.9, adult: Secondary | ICD-10-CM

## 2016-11-24 DIAGNOSIS — E6609 Other obesity due to excess calories: Secondary | ICD-10-CM | POA: Diagnosis not present

## 2016-11-24 NOTE — Assessment & Plan Note (Signed)
She will return to have her fasting labs done. Discussed dietary changes again how this affects her overall health risk for coronary artery disease in the setting of her diabetes and her hypertension.

## 2016-11-24 NOTE — Patient Instructions (Addendum)
F/U 4 months Physical  F/U next week for for lab visit - FASTING COntinue current medication

## 2016-11-24 NOTE — Assessment & Plan Note (Signed)
Blood pressure looks okay today. She is continue to monitor at home. Thinks she loses the weight her blood pressure will go right back down below 0:30 systolic and not to make any changes today.

## 2016-11-24 NOTE — Progress Notes (Signed)
   Subjective:    Patient ID: Leslie May, female    DOB: 01-03-1961, 56 y.o.   MRN: [id number]  Patient presents for Follow-up (is not fasting)  Patient to follow chronic medical process. She is no particular concerns today. She was taken off her metformin as her A1c was less than 6%. Unfortunately she she has gained about 10 pounds admits to eating sweets and not watching her diet little exercise. She is still taking her blood pressure medicine as prescribed by hay difficulties with this. She does so when we check her blood sugar is still less than 120 fasting. She also continues to smoke about a pack per day is not ready to quit.   Review Of Systems:  GEN- denies fatigue, fever, weight loss,weakness, recent illness HEENT- denies eye drainage, change in vision, nasal discharge, CVS- denies chest pain, palpitations RESP- denies SOB, cough, wheeze ABD- denies N/V, change in stools, abd pain GU- denies dysuria, hematuria, dribbling, incontinence MSK- denies joint pain, muscle aches, injury Neuro- denies headache, dizziness, syncope, seizure activity       Objective:    BP 138/84   Pulse 84   Temp 98.4 F (36.9 C) (Oral)   Resp 14   Ht 5\' 5"  (1.651 m)   Wt 202 lb (91.6 kg)   SpO2 97%   BMI 33.61 kg/m  GEN- NAD, alert and oriented x3 HEENT- PERRL, EOMI, non injected sclera, pink conjunctiva, MMM, oropharynx clear Neck- Supple, no thyromegaly CVS- RRR, no murmur RESP-CTAB EXT- No edema Pulses- Radial, DP- 2+        Assessment & Plan:      Problem List Items Addressed This Visit    Obesity   Tobacco use    Counseling tobacco cessation.      Hypertriglyceridemia    Per above for diet. She is also on fish oil she will return for fasting labs.      Relevant Orders   Lipid panel   Essential hypertension - Primary    Blood pressure looks okay today. She is continue to monitor at home. Thinks she loses the weight her blood pressure will go right back down below  8:91 systolic and not to make any changes today.      Relevant Orders   Comprehensive metabolic panel   CBC with Differential/Platelet   Diabetes mellitus ([place])    She will return to have her fasting labs done. Discussed dietary changes again how this affects her overall health risk for coronary artery disease in the setting of her diabetes and her hypertension.      Relevant Orders   Hemoglobin A1c   HM DIABETES FOOT EXAM (Completed)      Note: This dictation was prepared with Dragon dictation along with smaller phrase technology. Any transcriptional errors that result from this process are unintentional.

## 2016-11-24 NOTE — Assessment & Plan Note (Signed)
Per above for diet. She is also on fish oil she will return for fasting labs.

## 2016-11-24 NOTE — Assessment & Plan Note (Signed)
Counseling tobacco cessation.

## 2016-12-02 ENCOUNTER — Other Ambulatory Visit: Payer: Managed Care, Other (non HMO)

## 2016-12-02 DIAGNOSIS — I10 Essential (primary) hypertension: Secondary | ICD-10-CM

## 2016-12-02 DIAGNOSIS — E119 Type 2 diabetes mellitus without complications: Secondary | ICD-10-CM

## 2016-12-02 LAB — CBC WITH DIFFERENTIAL/PLATELET
BASOS ABS: 0 {cells}/uL (ref 0–200)
BASOS PCT: 0 %
EOS ABS: 100 {cells}/uL (ref 15–500)
EOS PCT: 1 %
HCT: 49.5 % — ABNORMAL HIGH (ref 35.0–45.0)
HEMOGLOBIN: 16.9 g/dL — AB (ref 12.0–15.0)
LYMPHS ABS: 4500 {cells}/uL — AB (ref 850–3900)
Lymphocytes Relative: 45 %
MCH: 31.1 pg (ref 27.0–33.0)
MCHC: 34.1 g/dL (ref 32.0–36.0)
MCV: 91.2 fL (ref 80.0–100.0)
MPV: 10.4 fL (ref 7.5–12.5)
Monocytes Absolute: 400 cells/uL (ref 200–950)
Monocytes Relative: 4 %
NEUTROS ABS: 5000 {cells}/uL (ref 1500–7800)
Neutrophils Relative %: 50 %
Platelets: 259 10*3/uL (ref 140–400)
RBC: 5.43 MIL/uL — ABNORMAL HIGH (ref 3.80–5.10)
RDW: 13.3 % (ref 11.0–15.0)
WBC: 10 10*3/uL (ref 3.8–10.8)

## 2016-12-02 LAB — COMPREHENSIVE METABOLIC PANEL
ALT: 13 U/L (ref 6–29)
AST: 14 U/L (ref 10–35)
Albumin: 4.4 g/dL (ref 3.6–5.1)
Alkaline Phosphatase: 64 U/L (ref 33–130)
BUN: 18 mg/dL (ref 7–25)
CALCIUM: 9.5 mg/dL (ref 8.6–10.4)
CO2: 21 mmol/L (ref 20–31)
Chloride: 103 mmol/L (ref 98–110)
Creat: 0.8 mg/dL (ref 0.50–1.05)
GLUCOSE: 101 mg/dL — AB (ref 70–99)
POTASSIUM: 4.8 mmol/L (ref 3.5–5.3)
Sodium: 139 mmol/L (ref 135–146)
Total Bilirubin: 0.5 mg/dL (ref 0.2–1.2)
Total Protein: 7.2 g/dL (ref 6.1–8.1)

## 2016-12-03 LAB — HEMOGLOBIN A1C
HEMOGLOBIN A1C: 5.3 % (ref <5.7)
Mean Plasma Glucose: 105 mg/dL

## 2016-12-14 ENCOUNTER — Encounter: Payer: Self-pay | Admitting: *Deleted

## 2017-03-23 ENCOUNTER — Encounter: Payer: Self-pay | Admitting: Family Medicine

## 2017-03-23 ENCOUNTER — Ambulatory Visit: Payer: Managed Care, Other (non HMO) | Admitting: Family Medicine

## 2017-03-23 ENCOUNTER — Other Ambulatory Visit: Payer: Self-pay

## 2017-03-23 VITALS — BP 130/74 | HR 82 | Temp 98.5°F | Resp 14 | Ht 65.0 in | Wt 205.0 lb

## 2017-03-23 DIAGNOSIS — E6609 Other obesity due to excess calories: Secondary | ICD-10-CM

## 2017-03-23 DIAGNOSIS — I10 Essential (primary) hypertension: Secondary | ICD-10-CM | POA: Diagnosis not present

## 2017-03-23 DIAGNOSIS — E119 Type 2 diabetes mellitus without complications: Secondary | ICD-10-CM | POA: Diagnosis not present

## 2017-03-23 DIAGNOSIS — Z720 Tobacco use: Secondary | ICD-10-CM

## 2017-03-23 DIAGNOSIS — Z6833 Body mass index (BMI) 33.0-33.9, adult: Secondary | ICD-10-CM | POA: Diagnosis not present

## 2017-03-23 NOTE — Progress Notes (Signed)
   Subjective:    Patient ID: Leslie May, female    DOB: 12-09-1960, 56 y.o.   MRN: [id number]  Patient presents for Follow-up [facility] appointment to close gaps in care)   DM- dietcontroled on A1C 5.3%    - No concerns  - dUE FOR uRINE Micro/ Foot exam     - Ophthalmology- has not schedule until next year  Declines flu shot  Tobacco- continues to smoke 1pppd   HTN- taking BP medications as prescribed    Review Of Systems:  GEN- denies fatigue, fever, weight loss,weakness, recent illness HEENT- denies eye drainage, change in vision, nasal discharge, CVS- denies chest pain, palpitations RESP- denies SOB, cough, wheeze ABD- denies N/V, change in stools, abd pain GU- denies dysuria, hematuria, dribbling, incontinence MSK- denies joint pain, muscle aches, injury Neuro- denies headache, dizziness, syncope, seizure activity       Objective:    BP 130/74   Pulse 82   Temp 98.5 F (36.9 C) (Oral)   Resp 14   Ht 5\' 5"  (1.651 m)   Wt 205 lb (93 kg)   SpO2 97%   BMI 34.11 kg/m  GEN- NAD, alert and oriented x3 HEENT- PERRL, EOMI, non injected sclera, pink conjunctiva, MMM, oropharynx clear CVS- RRR, no murmur RESP-CTAB ABD-NABS,soft,NT,ND EXT- No edema Pulses- Radial, DP- 2+        Assessment & Plan:      Problem List Items Addressed This Visit      Unprioritized   Obesity   Tobacco use    counseld on cessation, she is not ready to quit      Essential hypertension    Well controlled      Relevant Orders   LDL Cholesterol, Direct   Diabetes mellitus ([place]) - Primary    Diet controlled Eye appt will not be until 2019 Goal A1C < 7%  On ARB She is not fasting so direct LDL done      Relevant Orders   Microalbumin/Creatinine Ratio, Urine   Basic metabolic panel   Hemoglobin A1c   HM DIABETES FOOT EXAM (Completed)   LDL Cholesterol, Direct      Note: This dictation was prepared with Dragon dictation along with smaller phrase technology. Any  transcriptional errors that result from this process are unintentional.

## 2017-03-23 NOTE — Patient Instructions (Signed)
F/U 6 months Physical  

## 2017-03-23 NOTE — Assessment & Plan Note (Signed)
counseld on cessation, she is not ready to quit

## 2017-03-23 NOTE — Assessment & Plan Note (Signed)
Well controlled 

## 2017-03-23 NOTE — Assessment & Plan Note (Signed)
Diet controlled Eye appt will not be until 2019 Goal A1C < 7%  On ARB She is not fasting so direct LDL done

## 2017-03-24 LAB — BASIC METABOLIC PANEL
BUN: 16 mg/dL (ref 7–25)
CHLORIDE: 105 mmol/L (ref 98–110)
CO2: 25 mmol/L (ref 20–32)
Calcium: 9.9 mg/dL (ref 8.6–10.4)
Creat: 0.79 mg/dL (ref 0.50–1.05)
GLUCOSE: 81 mg/dL (ref 65–99)
Potassium: 4.1 mmol/L (ref 3.5–5.3)
Sodium: 141 mmol/L (ref 135–146)

## 2017-03-24 LAB — HEMOGLOBIN A1C
EAG (MMOL/L): 6.2 (calc)
HEMOGLOBIN A1C: 5.5 %{Hb} (ref <5.7)
MEAN PLASMA GLUCOSE: 111 (calc)

## 2017-03-24 LAB — LDL CHOLESTEROL, DIRECT: Direct LDL: 111 mg/dL — ABNORMAL HIGH (ref <100)

## 2017-03-24 LAB — MICROALBUMIN / CREATININE URINE RATIO: Creatinine, Urine: 38 mg/dL (ref 20–275)

## 2017-05-19 ENCOUNTER — Other Ambulatory Visit: Payer: Self-pay | Admitting: Family Medicine

## 2017-07-05 LAB — HM DEXA SCAN

## 2017-07-05 LAB — HM PAP SMEAR

## 2017-07-06 LAB — HM MAMMOGRAPHY

## 2017-08-17 ENCOUNTER — Other Ambulatory Visit: Payer: Self-pay | Admitting: Family Medicine

## 2017-08-19 ENCOUNTER — Other Ambulatory Visit: Payer: Self-pay | Admitting: Family Medicine

## 2017-09-21 ENCOUNTER — Encounter: Payer: Managed Care, Other (non HMO) | Admitting: Family Medicine

## 2017-10-12 ENCOUNTER — Encounter: Payer: Self-pay | Admitting: Family Medicine

## 2017-10-12 ENCOUNTER — Ambulatory Visit (INDEPENDENT_AMBULATORY_CARE_PROVIDER_SITE_OTHER): Payer: Managed Care, Other (non HMO) | Admitting: Family Medicine

## 2017-10-12 ENCOUNTER — Other Ambulatory Visit: Payer: Self-pay

## 2017-10-12 VITALS — BP 130/76 | HR 60 | Temp 98.6°F | Resp 16 | Ht 65.0 in | Wt 203.0 lb

## 2017-10-12 DIAGNOSIS — E781 Pure hyperglyceridemia: Secondary | ICD-10-CM | POA: Diagnosis not present

## 2017-10-12 DIAGNOSIS — I10 Essential (primary) hypertension: Secondary | ICD-10-CM | POA: Diagnosis not present

## 2017-10-12 DIAGNOSIS — E6609 Other obesity due to excess calories: Secondary | ICD-10-CM | POA: Diagnosis not present

## 2017-10-12 DIAGNOSIS — Z6833 Body mass index (BMI) 33.0-33.9, adult: Secondary | ICD-10-CM | POA: Diagnosis not present

## 2017-10-12 DIAGNOSIS — Z720 Tobacco use: Secondary | ICD-10-CM | POA: Diagnosis not present

## 2017-10-12 DIAGNOSIS — Z0000 Encounter for general adult medical examination without abnormal findings: Secondary | ICD-10-CM

## 2017-10-12 DIAGNOSIS — E119 Type 2 diabetes mellitus without complications: Secondary | ICD-10-CM

## 2017-10-12 NOTE — Progress Notes (Signed)
   Subjective:    Patient ID: Leslie May, female    DOB: 1960-11-13, 57 y.o.   MRN: [id number]  Patient presents for CPE (is not fasting)   Pt here for CPE Medications and history    Has GYN- [facility]- had mammogram and PAP Smear. Had f/u on breast for sore spot came back negative  Bone Density- has osteopenia- now on Vitamin D 5000    Tobacco use- no change in cigg    Due for colon cancer screen     DM- last A1C 5.5%, 113 fasting this morning , no current    Stopped weight watchers    Due for eye doctor visit    Immunizations- UTD, decline shingles shot   Review Of Systems:  GEN- denies fatigue, fever, weight loss,weakness, recent illness HEENT- denies eye drainage, change in vision, nasal discharge, CVS- denies chest pain, palpitations RESP- denies SOB, cough, wheeze ABD- denies N/V, change in stools, abd pain GU- denies dysuria, hematuria, dribbling, incontinence MSK- denies joint pain, muscle aches, injury Neuro- denies headache, dizziness, syncope, seizure activity       Objective:    BP 130/76   Pulse 60   Temp 98.6 F (37 C) (Oral)   Resp 16   Ht 5\' 5"  (1.651 m)   Wt 203 lb (92.1 kg)   SpO2 99%   BMI 33.78 kg/m  GEN- NAD, alert and oriented x3 HEENT- PERRL, EOMI, non injected sclera, pink conjunctiva, MMM, oropharynx clear Neck- Supple, no thyromegaly CVS- RRR, soft systolic murmur RESP-CTAB ABD-NABS,soft,NT,ND EXT- No edema Pulses- Radial, DP- 2+        Assessment & Plan:      Problem List Items Addressed This Visit      Unprioritized   Diabetes mellitus ([place])    Recheck A1C, goal < 7% Currently diet controlled      Relevant Orders   Hemoglobin A1c (Completed)   Essential hypertension    Well controlled      Relevant Orders   CBC with Differential/Platelet (Completed)   Comprehensive metabolic panel (Completed)   Hypertriglyceridemia   Relevant Orders   Lipid panel (Completed)   Obesity    Discussed returning  to weight watchers or some other type of program she can follow, for weight loss      Routine general medical examination at a health care facility - Primary    CPE done, fasting labs today  Obtain record from GYN cologuard screening      Relevant Orders   CBC with Differential/Platelet (Completed)   Comprehensive metabolic panel (Completed)   Tobacco use    Continue to counsel on cessation         Note: This dictation was prepared with Dragon dictation along with smaller phrase technology. Any transcriptional errors that result from this process are unintentional.

## 2017-10-12 NOTE — Patient Instructions (Addendum)
Cologuard to be ordered  F/U 6 months

## 2017-10-13 ENCOUNTER — Encounter: Payer: Self-pay | Admitting: Family Medicine

## 2017-10-13 LAB — CBC WITH DIFFERENTIAL/PLATELET
BASOS PCT: 0.5 %
Basophils Absolute: 56 cells/uL (ref 0–200)
Eosinophils Absolute: 101 cells/uL (ref 15–500)
Eosinophils Relative: 0.9 %
HCT: 46 % — ABNORMAL HIGH (ref 35.0–45.0)
HEMOGLOBIN: 16 g/dL — AB (ref 11.7–15.5)
Lymphs Abs: 4032 cells/uL — ABNORMAL HIGH (ref 850–3900)
MCH: 30.5 pg (ref 27.0–33.0)
MCHC: 34.8 g/dL (ref 32.0–36.0)
MCV: 87.8 fL (ref 80.0–100.0)
MONOS PCT: 7.8 %
MPV: 11.4 fL (ref 7.5–12.5)
NEUTROS ABS: 6138 {cells}/uL (ref 1500–7800)
Neutrophils Relative %: 54.8 %
Platelets: 268 10*3/uL (ref 140–400)
RBC: 5.24 10*6/uL — AB (ref 3.80–5.10)
RDW: 12.5 % (ref 11.0–15.0)
TOTAL LYMPHOCYTE: 36 %
WBC: 11.2 10*3/uL — AB (ref 3.8–10.8)
WBCMIX: 874 {cells}/uL (ref 200–950)

## 2017-10-13 LAB — COMPREHENSIVE METABOLIC PANEL
AG Ratio: 1.7 (calc) (ref 1.0–2.5)
ALBUMIN MSPROF: 4.3 g/dL (ref 3.6–5.1)
ALKALINE PHOSPHATASE (APISO): 65 U/L (ref 33–130)
ALT: 11 U/L (ref 6–29)
AST: 12 U/L (ref 10–35)
BUN: 18 mg/dL (ref 7–25)
CO2: 27 mmol/L (ref 20–32)
CREATININE: 0.82 mg/dL (ref 0.50–1.05)
Calcium: 9.8 mg/dL (ref 8.6–10.4)
Chloride: 105 mmol/L (ref 98–110)
GLUCOSE: 96 mg/dL (ref 65–99)
Globulin: 2.6 g/dL (calc) (ref 1.9–3.7)
Potassium: 4.2 mmol/L (ref 3.5–5.3)
Sodium: 139 mmol/L (ref 135–146)
Total Bilirubin: 0.5 mg/dL (ref 0.2–1.2)
Total Protein: 6.9 g/dL (ref 6.1–8.1)

## 2017-10-13 LAB — HEMOGLOBIN A1C
EAG (MMOL/L): 5.8 (calc)
HEMOGLOBIN A1C: 5.3 %{Hb} (ref <5.7)
Mean Plasma Glucose: 105 (calc)

## 2017-10-13 LAB — LIPID PANEL
CHOL/HDL RATIO: 7.1 (calc) — AB (ref <5.0)
CHOLESTEROL: 184 mg/dL (ref <200)
HDL: 26 mg/dL — ABNORMAL LOW (ref >50)
NON-HDL CHOLESTEROL (CALC): 158 mg/dL — AB (ref <130)
Triglycerides: 455 mg/dL — ABNORMAL HIGH (ref <150)

## 2017-10-13 NOTE — Assessment & Plan Note (Signed)
Continue to counsel on cessation

## 2017-10-13 NOTE — Assessment & Plan Note (Signed)
Discussed returning to weight watchers or some other type of program she can follow, for weight loss

## 2017-10-13 NOTE — Assessment & Plan Note (Addendum)
Recheck A1C, goal < 7% Currently diet controlled

## 2017-10-13 NOTE — Assessment & Plan Note (Signed)
Well controlled 

## 2017-10-13 NOTE — Assessment & Plan Note (Addendum)
CPE done, fasting labs today  Obtain record from GYN cologuard screening

## 2017-10-14 ENCOUNTER — Other Ambulatory Visit: Payer: Self-pay | Admitting: *Deleted

## 2017-10-14 ENCOUNTER — Encounter: Payer: Self-pay | Admitting: *Deleted

## 2017-10-14 ENCOUNTER — Telehealth: Payer: Self-pay | Admitting: *Deleted

## 2017-10-14 MED ORDER — ATORVASTATIN CALCIUM 20 MG PO TABS: 20.0000 mg | ORAL_TABLET | Freq: Every day | ORAL | 3 refills | Status: DC

## 2017-10-14 NOTE — Telephone Encounter (Signed)
Received verbal orders for Cologuard.   Order placed via [employer].   Order [id number] has been submitted.

## 2017-10-21 ENCOUNTER — Encounter: Payer: Self-pay | Admitting: *Deleted

## 2017-10-26 ENCOUNTER — Encounter: Payer: Self-pay | Admitting: Family Medicine

## 2017-10-26 LAB — COLOGUARD

## 2017-11-09 ENCOUNTER — Encounter: Payer: Self-pay | Admitting: *Deleted

## 2017-11-09 NOTE — Telephone Encounter (Signed)
Received Cologuard results.   Noted negative.   Letter sent.  

## 2018-01-23 ENCOUNTER — Encounter: Payer: Self-pay | Admitting: Family Medicine

## 2018-01-23 ENCOUNTER — Ambulatory Visit: Payer: Managed Care, Other (non HMO) | Admitting: Family Medicine

## 2018-01-23 VITALS — BP 140/80 | HR 64 | Temp 98.0°F | Resp 18 | Ht 65.0 in | Wt 205.0 lb

## 2018-01-23 DIAGNOSIS — J209 Acute bronchitis, unspecified: Secondary | ICD-10-CM | POA: Diagnosis not present

## 2018-01-23 MED ORDER — ALBUTEROL SULFATE HFA 108 (90 BASE) MCG/ACT IN AERS: 2.0000 | INHALATION_SPRAY | Freq: Four times a day (QID) | RESPIRATORY_TRACT | 0 refills | Status: DC | PRN

## 2018-01-23 MED ORDER — BENZONATATE 100 MG PO CAPS: 200.0000 mg | ORAL_CAPSULE | Freq: Three times a day (TID) | ORAL | 0 refills | Status: DC | PRN

## 2018-01-23 MED ORDER — PREDNISONE 20 MG PO TABS: ORAL_TABLET | ORAL | 0 refills | Status: DC

## 2018-01-23 MED ORDER — AZITHROMYCIN 250 MG PO TABS: ORAL_TABLET | ORAL | 0 refills | Status: DC

## 2018-01-23 NOTE — Progress Notes (Signed)
Subjective:    Patient ID: Leslie May, female    DOB: June 01, 1960, 57 y.o.   MRN: [id number]  HPI Patient has been sick for a total of 10 days.  Symptoms began similar to a cold with rhinorrhea, sore throat, and head congestion.  However over the last 10 days, it has settled in her chest.  She reports difficulty breathing, expiratory wheezing, tightness in her chest, a cough productive of green and brown sputum, pleuritic pain left greater than right.  She denies any fever.  She denies any hemoptysis.  Past medical history is for tobacco abuse.  She has no history of asthma or COPD to her knowledge.  However on exam today, the patient has diminished breath sounds bilaterally with prominent expiratory wheezes bilaterally. Past Medical History:  Diagnosis Date  . Cancer ([place]) > 20 years ago   cervical cancer s/p cryosurgery  . Diabetes mellitus without complication ([place])   . Hypertension    Past Surgical History:  Procedure Laterality Date  . arm surgery  2006   to repair pinched nerve in left elbow  . HYSTEROSCOPY W/D&C N/A 08/31/2013   Procedure: DILATATION AND CURETTAGE /HYSTEROSCOPY;  Surgeon: Marylynn Pearson, MD;  Location: [facility];  Service: Gynecology;  Laterality: N/A;  . TONSILLECTOMY    . TUBAL LIGATION     Current Outpatient Medications on File Prior to Visit  Medication Sig Dispense Refill  . aspirin 81 MG tablet Take 81 mg by mouth daily.    . cholecalciferol (VITAMIN D) 1000 units tablet Take 5,000 Units by mouth daily.     . Cinnamon 500 MG TABS Take by mouth.    . Cyanocobalamin (VITAMIN B-12) 5000 MCG TBDP Take by mouth.    . ESTROGENS CONJUGATED PO Take by mouth.    . Multiple Vitamin (MULTIVITAMIN WITH MINERALS) TABS tablet Take 1 tablet by mouth daily. Menopause Support formulation    . olmesartan (BENICAR) 20 MG tablet TAKE ONE-HALF TABLET BY MOUTH DAILY 45 tablet 2  . Omega-3 Fatty Acids (FISH OIL) 1000 MG CAPS (1) cap PO BID (Patient taking differently: Take 1  capsule by mouth 2 (two) times daily. )  0  . omeprazole (PRILOSEC) 20 MG capsule TAKE ONE CAPSULE BY MOUTH DAILY 90 capsule 2  . progesterone (PROMETRIUM) 100 MG capsule     . Verapamil HCl CR 300 MG CP24 TAKE ONE CAPSULE BY MOUTH DAILY 90 each 2   No current facility-administered medications on file prior to visit.    Allergies  Allergen Reactions  . Hctz [Hydrochlorothiazide] Itching  . Norvasc [Amlodipine Besylate] Swelling    Throat swelling  . Penicillins Swelling    Throat swelling   Social History   Socioeconomic History  . Marital status: Married    Spouse name: Not on file  . Number of children: Not on file  . Years of education: Not on file  . Highest education level: Not on file  Occupational History  . Not on file  Social Needs  . Financial resource strain: Not on file  . Food insecurity:    Worry: Not on file    Inability: Not on file  . Transportation needs:    Medical: Not on file    Non-medical: Not on file  Tobacco Use  . Smoking status: Current Every Day Smoker    Packs/day: 2.00    Types: Cigarettes  . Smokeless tobacco: Never Used  Substance and Sexual Activity  . Alcohol use: No  . Drug  use: No  . Sexual activity: Yes  Lifestyle  . Physical activity:    Days per week: Not on file    Minutes per session: Not on file  . Stress: Not on file  Relationships  . Social connections:    Talks on phone: Not on file    Gets together: Not on file    Attends religious service: Not on file    Active member of club or organization: Not on file    Attends meetings of clubs or organizations: Not on file    Relationship status: Not on file  . Intimate partner violence:    Fear of current or ex partner: Not on file    Emotionally abused: Not on file    Physically abused: Not on file    Forced sexual activity: Not on file  Other Topics Concern  . Not on file  Social History Narrative  . Not on file      Review of Systems  All other systems  reviewed and are negative.      Objective:   Physical Exam  Constitutional: She appears well-developed and well-nourished.  HENT:  Nose: Nose normal.  Mouth/Throat: Oropharynx is clear and moist. No oropharyngeal exudate.  Eyes: Conjunctivae are normal.  Cardiovascular: Normal rate, regular rhythm and normal heart sounds.  Pulmonary/Chest: Effort normal. No respiratory distress. She has wheezes. She has no rales.  Lymphadenopathy:    She has no cervical adenopathy.  Vitals reviewed.         Assessment & Plan:  Bronchitis with bronchospasm  Spent 20 minutes today with the patient.  Recommended smoking cessation strongly.  Explained to the patient that I believe she is possibly showing signs of early emphysema.  I explained that only smoking cessation will prevent the progression towards chronic bronchitis and emphysema.  Recommended a prednisone taper pack coupled with albuterol 2 puffs inhaled every 4-6 hours as needed for wheezing and shortness of breath.  Given the brown purulent sputum, the chest pain, the shortness of breath, I will treat the patient with a Z-Pak.  Also recommended Mucinex.  She can use Tessalon Perles 200 mg every 8 hours as needed for cough.

## 2018-02-11 ENCOUNTER — Other Ambulatory Visit: Payer: Self-pay | Admitting: Family Medicine

## 2018-04-19 ENCOUNTER — Ambulatory Visit: Payer: Managed Care, Other (non HMO) | Admitting: Family Medicine

## 2018-05-15 ENCOUNTER — Other Ambulatory Visit: Payer: Self-pay | Admitting: Family Medicine

## 2018-05-16 ENCOUNTER — Ambulatory Visit: Payer: Managed Care, Other (non HMO) | Admitting: Family Medicine

## 2018-05-17 ENCOUNTER — Other Ambulatory Visit: Payer: Self-pay

## 2018-05-17 ENCOUNTER — Encounter: Payer: Self-pay | Admitting: Family Medicine

## 2018-05-17 ENCOUNTER — Ambulatory Visit: Payer: Managed Care, Other (non HMO) | Admitting: Family Medicine

## 2018-05-17 VITALS — BP 132/62 | HR 86 | Temp 98.5°F | Resp 14 | Ht 65.0 in | Wt 208.0 lb

## 2018-05-17 DIAGNOSIS — E781 Pure hyperglyceridemia: Secondary | ICD-10-CM | POA: Diagnosis not present

## 2018-05-17 DIAGNOSIS — L989 Disorder of the skin and subcutaneous tissue, unspecified: Secondary | ICD-10-CM

## 2018-05-17 DIAGNOSIS — E6609 Other obesity due to excess calories: Secondary | ICD-10-CM | POA: Diagnosis not present

## 2018-05-17 DIAGNOSIS — E119 Type 2 diabetes mellitus without complications: Secondary | ICD-10-CM | POA: Diagnosis not present

## 2018-05-17 DIAGNOSIS — L570 Actinic keratosis: Secondary | ICD-10-CM

## 2018-05-17 DIAGNOSIS — Z6833 Body mass index (BMI) 33.0-33.9, adult: Secondary | ICD-10-CM

## 2018-05-17 DIAGNOSIS — I10 Essential (primary) hypertension: Secondary | ICD-10-CM | POA: Diagnosis not present

## 2018-05-17 DIAGNOSIS — Z720 Tobacco use: Secondary | ICD-10-CM

## 2018-05-17 NOTE — Patient Instructions (Signed)
F/U 6 months for Physical  We will call with lab results  

## 2018-05-17 NOTE — Progress Notes (Signed)
   Subjective:    Patient ID: Leslie May, female    DOB: [birth year], 58 y.o.   MRN: [id number]  Patient presents for Follow-up (is fasting)   DM- she is working out and has been changing diet, CBG around 110 average fasting    Last A1C 5.3%, no current medications   Smoking- continues to smoke , smoking 1ppd, she has tobacco cessation coach, she has tried wellbutrin, she is scared to try the Chantix because of her husbands side effects   HTN- taking benicar / verapamil   Hyperlipidemia- did not take, lipitor 10mg  no specific reason   She is taking fish oil and garlic    Still on progresterone and the estrogen   Due for eye exam   has spot on left side of nose for past 6 months and few other skin spots on face not going away     Review Of Systems:  GEN- denies fatigue, fever, weight loss,weakness, recent illness HEENT- denies eye drainage, change in vision, nasal discharge, CVS- denies chest pain, palpitations RESP- denies SOB, cough, wheeze ABD- denies N/V, change in stools, abd pain GU- denies dysuria, hematuria, dribbling, incontinence MSK- denies joint pain, muscle aches, injury Neuro- denies headache, dizziness, syncope, seizure activity       Objective:    BP 132/62   Pulse 86   Temp 98.5 F (36.9 C) (Oral)   Resp 14   Ht 5\' 5"  (1.651 m)   Wt 208 lb (94.3 kg)   SpO2 100%   BMI 34.61 kg/m  GEN- NAD, alert and oriented x3 HEENT- PERRL, EOMI, non injected sclera, pink conjunctiva, MMM, oropharynx clear Neck- Supple, no thyromegaly CVS- RRR, soft systolic murmur RESP-CTAB ABD-NABS,soft,NT,ND Skin- smallmacularpapular lesion on left nares, mild erythema no scale, scabs ant to left ear, right neck EXT- No edema Pulses- Radial, DP- 2+        Assessment & Plan:      Problem List Items Addressed This Visit      Unprioritized   Diabetes mellitus ([place])    Diabetes has been controlled no current meds      Relevant Orders   Lipid panel  (Completed)   Hemoglobin A1c (Completed)   HM DIABETES FOOT EXAM (Completed)   Microalbumin / creatinine urine ratio (Completed)   Essential hypertension - Primary    Controlled no changes      Relevant Orders   CBC with Differential/Platelet (Completed)   Comprehensive metabolic panel (Completed)   Hypertriglyceridemia    Recheck cholesterol, if still elevated start lipitor 10mg       Relevant Orders   Hemoglobin A1c (Completed)   Obesity   Tobacco use    Discussed cessation, she is going to try leaving cig in the car, so she has to walk out to get them       Other Visit Diagnoses    Skin lesions       Relevant Orders   Ambulatory referral to Dermatology   AK (actinic keratosis)       referral to dermatology for lesion on nose   Relevant Orders   Ambulatory referral to Dermatology      Note: This dictation was prepared with Dragon dictation along with smaller phrase technology. Any transcriptional errors that result from this process are unintentional.

## 2018-05-18 ENCOUNTER — Encounter: Payer: Self-pay | Admitting: Family Medicine

## 2018-05-18 ENCOUNTER — Encounter: Payer: Self-pay | Admitting: *Deleted

## 2018-05-18 LAB — CBC WITH DIFFERENTIAL/PLATELET
ABSOLUTE MONOCYTES: 550 {cells}/uL (ref 200–950)
BASOS PCT: 0.6 %
Basophils Absolute: 60 cells/uL (ref 0–200)
EOS PCT: 0.8 %
Eosinophils Absolute: 80 cells/uL (ref 15–500)
HEMATOCRIT: 50.1 % — AB (ref 35.0–45.0)
HEMOGLOBIN: 16.9 g/dL — AB (ref 11.7–15.5)
Lymphs Abs: 4070 cells/uL — ABNORMAL HIGH (ref 850–3900)
MCH: 30.2 pg (ref 27.0–33.0)
MCHC: 33.7 g/dL (ref 32.0–36.0)
MCV: 89.5 fL (ref 80.0–100.0)
MPV: 11.2 fL (ref 7.5–12.5)
Monocytes Relative: 5.5 %
NEUTROS ABS: 5240 {cells}/uL (ref 1500–7800)
Neutrophils Relative %: 52.4 %
Platelets: 270 10*3/uL (ref 140–400)
RBC: 5.6 10*6/uL — ABNORMAL HIGH (ref 3.80–5.10)
RDW: 12.3 % (ref 11.0–15.0)
Total Lymphocyte: 40.7 %
WBC: 10 10*3/uL (ref 3.8–10.8)

## 2018-05-18 LAB — COMPREHENSIVE METABOLIC PANEL
AG Ratio: 1.8 (calc) (ref 1.0–2.5)
ALBUMIN MSPROF: 4.6 g/dL (ref 3.6–5.1)
ALKALINE PHOSPHATASE (APISO): 65 U/L (ref 33–130)
ALT: 15 U/L (ref 6–29)
AST: 14 U/L (ref 10–35)
BUN: 14 mg/dL (ref 7–25)
CHLORIDE: 103 mmol/L (ref 98–110)
CO2: 24 mmol/L (ref 20–32)
CREATININE: 0.71 mg/dL (ref 0.50–1.05)
Calcium: 9.7 mg/dL (ref 8.6–10.4)
GLOBULIN: 2.5 g/dL (ref 1.9–3.7)
GLUCOSE: 100 mg/dL — AB (ref 65–99)
POTASSIUM: 4.1 mmol/L (ref 3.5–5.3)
Sodium: 140 mmol/L (ref 135–146)
Total Bilirubin: 0.5 mg/dL (ref 0.2–1.2)
Total Protein: 7.1 g/dL (ref 6.1–8.1)

## 2018-05-18 LAB — LIPID PANEL
CHOL/HDL RATIO: 6.2 (calc) — AB (ref <5.0)
Cholesterol: 180 mg/dL (ref <200)
HDL: 29 mg/dL — AB (ref >50)
LDL CHOLESTEROL (CALC): 109 mg/dL — AB
NON-HDL CHOLESTEROL (CALC): 151 mg/dL — AB (ref <130)
TRIGLYCERIDES: 317 mg/dL — AB (ref <150)

## 2018-05-18 LAB — MICROALBUMIN / CREATININE URINE RATIO
Creatinine, Urine: 18 mg/dL — ABNORMAL LOW (ref 20–275)
Microalb, Ur: <0.2 mg/dL

## 2018-05-18 LAB — HEMOGLOBIN A1C
Hgb A1c MFr Bld: 5.8 % of total Hgb — ABNORMAL HIGH (ref <5.7)
Mean Plasma Glucose: 120 (calc)
eAG (mmol/L): 6.6 (calc)

## 2018-05-18 NOTE — Assessment & Plan Note (Signed)
Controlled no changes 

## 2018-05-18 NOTE — Assessment & Plan Note (Signed)
Discussed cessation, she is going to try leaving cig in the car, so she has to walk out to get them

## 2018-05-18 NOTE — Assessment & Plan Note (Signed)
Diabetes has been controlled no current meds

## 2018-05-18 NOTE — Assessment & Plan Note (Signed)
Recheck cholesterol, if still elevated start lipitor 10mg 

## 2018-08-12 ENCOUNTER — Other Ambulatory Visit: Payer: Self-pay | Admitting: Family Medicine

## 2018-11-07 ENCOUNTER — Other Ambulatory Visit: Payer: Self-pay | Admitting: Family Medicine

## 2018-11-28 ENCOUNTER — Other Ambulatory Visit: Payer: Self-pay

## 2018-11-29 ENCOUNTER — Ambulatory Visit (INDEPENDENT_AMBULATORY_CARE_PROVIDER_SITE_OTHER): Payer: Managed Care, Other (non HMO) | Admitting: Family Medicine

## 2018-11-29 ENCOUNTER — Encounter: Payer: Self-pay | Admitting: Family Medicine

## 2018-11-29 VITALS — BP 128/78 | HR 64 | Temp 98.3°F | Resp 12 | Ht 65.0 in | Wt 209.0 lb

## 2018-11-29 DIAGNOSIS — E119 Type 2 diabetes mellitus without complications: Secondary | ICD-10-CM | POA: Diagnosis not present

## 2018-11-29 DIAGNOSIS — I10 Essential (primary) hypertension: Secondary | ICD-10-CM

## 2018-11-29 DIAGNOSIS — Z6833 Body mass index (BMI) 33.0-33.9, adult: Secondary | ICD-10-CM

## 2018-11-29 DIAGNOSIS — F419 Anxiety disorder, unspecified: Secondary | ICD-10-CM

## 2018-11-29 DIAGNOSIS — Z720 Tobacco use: Secondary | ICD-10-CM

## 2018-11-29 DIAGNOSIS — F32A Depression, unspecified: Secondary | ICD-10-CM

## 2018-11-29 DIAGNOSIS — F329 Major depressive disorder, single episode, unspecified: Secondary | ICD-10-CM

## 2018-11-29 DIAGNOSIS — Z0001 Encounter for general adult medical examination with abnormal findings: Secondary | ICD-10-CM | POA: Diagnosis not present

## 2018-11-29 DIAGNOSIS — Z0000 Encounter for general adult medical examination without abnormal findings: Secondary | ICD-10-CM

## 2018-11-29 DIAGNOSIS — E781 Pure hyperglyceridemia: Secondary | ICD-10-CM

## 2018-11-29 DIAGNOSIS — E6609 Other obesity due to excess calories: Secondary | ICD-10-CM

## 2018-11-29 MED ORDER — ESCITALOPRAM OXALATE 5 MG PO TABS: 5.0000 mg | ORAL_TABLET | Freq: Every day | ORAL | 3 refills | Status: DC

## 2018-11-29 NOTE — Patient Instructions (Addendum)
Schedule eye visit Start lexapro 5mg  at bedtime to help stress/smoking F/U telephone visit in 4 weeks

## 2018-11-29 NOTE — Progress Notes (Addendum)
   Subjective:    Patient ID: Leslie May, female    DOB: 20-Nov-1960, 58 y.o.   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, watches grandson too, she does get overwhelmed sometimes just wants to cry, sleep is good, does stress eat some     Review Of Systems:  GEN- denies fatigue, fever, weight loss,weakness, recent illness HEENT- denies eye drainage, change in vision, nasal discharge, CVS- denies chest pain, palpitations RESP- denies SOB, cough, wheeze ABD- denies N/V, change in stools, abd pain GU- denies dysuria, hematuria, dribbling, incontinence MSK- denies joint pain, muscle aches, injury Neuro- denies headache, dizziness, syncope, seizure activity       Objective:    BP 128/78   Pulse 64   Temp 98.3 F (36.8 C) (Oral)   Resp 12   Ht 5\' 5"  (1.651 m)   Wt 209 lb (94.8 kg)   SpO2 98%   BMI 34.78 kg/m  GEN- NAD, alert and oriented x3 HEENT- PERRL, EOMI, non injected sclera, pink conjunctiva, MMM, oropharynx clear Neck- Supple, no thyromegaly CVS- RRR, no murmur RESP-CTAB Psych normal affect and mood  EXT- No edema Pulses- Radial, DP- 2+        Assessment & Plan:      Problem List Items Addressed This Visit      Unprioritized   Anxiety and depression    Start lexapro 5mg  at bedtime F/u via phone in 4 weeks      Relevant Medications   escitalopram (LEXAPRO) 5 MG tablet   Diabetes mellitus (HCC)    Currently diet controlled, goal A1C less than 7  Recommend she start statin drug Working on dietary changes, she did best with [employer] structure      Relevant Orders   CBC with Differential/Platelet (Completed)   Comprehensive metabolic panel  (Completed)   Hemoglobin A1c (Completed)   Lipid panel (Completed)   Essential hypertension    Well controlled no changes       Hypertriglyceridemia   Relevant Orders   Lipid panel (Completed)   Obesity   Routine general medical examination at a health care facility - Primary    CPE schedule with [facility] Overdue for eye exam      Tobacco use      Note: This dictation was prepared with Dragon dictation along with smaller phrase technology. Any transcriptional errors that result from this process are unintentional.

## 2018-11-30 ENCOUNTER — Encounter: Payer: Self-pay | Admitting: Family Medicine

## 2018-11-30 ENCOUNTER — Other Ambulatory Visit: Payer: Self-pay | Admitting: *Deleted

## 2018-11-30 DIAGNOSIS — D72829 Elevated white blood cell count, unspecified: Secondary | ICD-10-CM

## 2018-11-30 LAB — COMPREHENSIVE METABOLIC PANEL
AG Ratio: 1.7 (calc) (ref 1.0–2.5)
ALT: 15 U/L (ref 6–29)
AST: 12 U/L (ref 10–35)
Albumin: 4.3 g/dL (ref 3.6–5.1)
Alkaline phosphatase (APISO): 66 U/L (ref 37–153)
BUN: 15 mg/dL (ref 7–25)
CO2: 24 mmol/L (ref 20–32)
Calcium: 9.9 mg/dL (ref 8.6–10.4)
Chloride: 105 mmol/L (ref 98–110)
Creat: 0.69 mg/dL (ref 0.50–1.05)
Globulin: 2.6 g/dL (calc) (ref 1.9–3.7)
Glucose, Bld: 99 mg/dL (ref 65–99)
Potassium: 4.7 mmol/L (ref 3.5–5.3)
Sodium: 140 mmol/L (ref 135–146)
Total Bilirubin: 0.6 mg/dL (ref 0.2–1.2)
Total Protein: 6.9 g/dL (ref 6.1–8.1)

## 2018-11-30 LAB — CBC WITH DIFFERENTIAL/PLATELET
Absolute Monocytes: 667 cells/uL (ref 200–950)
Basophils Absolute: 45 cells/uL (ref 0–200)
Basophils Relative: 0.4 %
Eosinophils Absolute: 124 cells/uL (ref 15–500)
Eosinophils Relative: 1.1 %
HCT: 47.8 % — ABNORMAL HIGH (ref 35.0–45.0)
Hemoglobin: 16.9 g/dL — ABNORMAL HIGH (ref 11.7–15.5)
Lymphs Abs: 4091 cells/uL — ABNORMAL HIGH (ref 850–3900)
MCH: 30.8 pg (ref 27.0–33.0)
MCHC: 35.4 g/dL (ref 32.0–36.0)
MCV: 87.1 fL (ref 80.0–100.0)
MPV: 10.2 fL (ref 7.5–12.5)
Monocytes Relative: 5.9 %
Neutro Abs: 6373 cells/uL (ref 1500–7800)
Neutrophils Relative %: 56.4 %
Platelets: 262 10*3/uL (ref 140–400)
RBC: 5.49 10*6/uL — ABNORMAL HIGH (ref 3.80–5.10)
RDW: 12.8 % (ref 11.0–15.0)
Total Lymphocyte: 36.2 %
WBC: 11.3 10*3/uL — ABNORMAL HIGH (ref 3.8–10.8)

## 2018-11-30 LAB — LIPID PANEL
Cholesterol: 191 mg/dL (ref <200)
HDL: 29 mg/dL — ABNORMAL LOW (ref >=50)
LDL Cholesterol (Calc): 121 mg/dL (calc) — ABNORMAL HIGH
Non-HDL Cholesterol (Calc): 162 mg/dL (calc) — ABNORMAL HIGH (ref <130)
Total CHOL/HDL Ratio: 6.6 (calc) — ABNORMAL HIGH (ref <5.0)
Triglycerides: 267 mg/dL — ABNORMAL HIGH (ref <150)

## 2018-11-30 LAB — HEMOGLOBIN A1C
Hgb A1c MFr Bld: 5.5 % of total Hgb (ref <5.7)
Mean Plasma Glucose: 111 (calc)
eAG (mmol/L): 6.2 (calc)

## 2018-11-30 NOTE — Assessment & Plan Note (Addendum)
Currently diet controlled, goal A1C less than 7  Recommend she start statin drug Working on dietary changes, she did best with [employer] structure

## 2018-11-30 NOTE — Assessment & Plan Note (Signed)
Well controlled no changes 

## 2018-11-30 NOTE — Assessment & Plan Note (Signed)
Start lexapro 5mg  at bedtime F/u via phone in 4 weeks

## 2018-11-30 NOTE — Assessment & Plan Note (Addendum)
CPE schedule with [facility] Overdue for eye exam

## 2018-12-27 ENCOUNTER — Encounter: Payer: Self-pay | Admitting: Family Medicine

## 2018-12-27 ENCOUNTER — Ambulatory Visit (INDEPENDENT_AMBULATORY_CARE_PROVIDER_SITE_OTHER): Payer: Managed Care, Other (non HMO) | Admitting: Family Medicine

## 2018-12-27 ENCOUNTER — Other Ambulatory Visit: Payer: Self-pay

## 2018-12-27 DIAGNOSIS — I10 Essential (primary) hypertension: Secondary | ICD-10-CM

## 2018-12-27 DIAGNOSIS — E6609 Other obesity due to excess calories: Secondary | ICD-10-CM | POA: Diagnosis not present

## 2018-12-27 DIAGNOSIS — E781 Pure hyperglyceridemia: Secondary | ICD-10-CM | POA: Diagnosis not present

## 2018-12-27 DIAGNOSIS — F419 Anxiety disorder, unspecified: Secondary | ICD-10-CM | POA: Diagnosis not present

## 2018-12-27 DIAGNOSIS — F32A Depression, unspecified: Secondary | ICD-10-CM

## 2018-12-27 DIAGNOSIS — D72829 Elevated white blood cell count, unspecified: Secondary | ICD-10-CM

## 2018-12-27 DIAGNOSIS — F329 Major depressive disorder, single episode, unspecified: Secondary | ICD-10-CM

## 2018-12-27 DIAGNOSIS — Z6833 Body mass index (BMI) 33.0-33.9, adult: Secondary | ICD-10-CM

## 2018-12-27 NOTE — Progress Notes (Signed)
Virtual Visit via Telephone Note  I connected with Leslie May on 12/27/18 at 4:29pm by telephone and verified that I am speaking with the correct person using two identifiers.        Pt location: at home   Physician location:  In office, [facility], Vic Blackbird MD     On call: patient and physician   I discussed the limitations, risks, security and privacy concerns of performing an evaluation and management service by telephone and the availability of in person appointments. I also discussed with the patient that there may be a patient responsible charge related to this service. The patient expressed understanding and agreed to proceed.   History of Present Illness:  Tele visit to f/u lexapro, she took for 2 days, had tingling of lips and face, felt weird so she stopped. States she doesn't want to be on anything at this time, feels okay  reviewed recent labs, cholesterol elevated, declines statin drug, states she is going to take red yeast rice, but then stated she did try taking daily didn't like it, she is taking omega 3 Given bactrim for UTI by GYN for 10 days , wondered if this could have raised her WBC, note no symptoms of UTI at OV Asked about a supplement, berberine to take for weight loss and BP    Observations/Objective: NAD noted  Assessment and Plan:  Hyperlipidemia- Taking Omega 3/ Red yeast rice three times a week consistently pt willing to try   Leukocytosis- very mild, has had transiently in past, UTI could also cause, she is still worried about cancer, advised she can come Monday and have labs rechecked Anxiety- declines meds at this time Berberine- she can try the supplement needs to monitor her BP, use lowest does possible, discussed true changes in diet, carbs, exercise best way to lose weight    Follow Up Instructions:    I discussed the assessment and treatment plan with the patient. The patient was provided an opportunity to ask questions  and all were answered. The patient agreed with the plan and demonstrated an understanding of the instructions.   The patient was advised to call back or seek an in-person evaluation if the symptoms worsen or if the condition fails to improve as anticipated.  I provided 16 minutes of non-face-to-face time during this encounter. End time: 4:45pm  Vic Blackbird, MD

## 2019-01-01 ENCOUNTER — Other Ambulatory Visit: Payer: Self-pay

## 2019-01-01 ENCOUNTER — Other Ambulatory Visit: Payer: Managed Care, Other (non HMO)

## 2019-01-01 DIAGNOSIS — D72829 Elevated white blood cell count, unspecified: Secondary | ICD-10-CM

## 2019-01-02 LAB — CBC WITH DIFFERENTIAL/PLATELET
Absolute Monocytes: 580 cells/uL (ref 200–950)
Basophils Absolute: 60 cells/uL (ref 0–200)
Basophils Relative: 0.6 %
Eosinophils Absolute: 170 cells/uL (ref 15–500)
Eosinophils Relative: 1.7 %
HCT: 45.3 % — ABNORMAL HIGH (ref 35.0–45.0)
Hemoglobin: 15.2 g/dL (ref 11.7–15.5)
Lymphs Abs: 3300 cells/uL (ref 850–3900)
MCH: 30.5 pg (ref 27.0–33.0)
MCHC: 33.6 g/dL (ref 32.0–36.0)
MCV: 91 fL (ref 80.0–100.0)
MPV: 11.3 fL (ref 7.5–12.5)
Monocytes Relative: 5.8 %
Neutro Abs: 5890 cells/uL (ref 1500–7800)
Neutrophils Relative %: 58.9 %
Platelets: 261 10*3/uL (ref 140–400)
RBC: 4.98 10*6/uL (ref 3.80–5.10)
RDW: 12.5 % (ref 11.0–15.0)
Total Lymphocyte: 33 %
WBC: 10 10*3/uL (ref 3.8–10.8)

## 2019-01-02 LAB — PATHOLOGIST SMEAR REVIEW

## 2019-02-04 ENCOUNTER — Other Ambulatory Visit: Payer: Self-pay | Admitting: Family Medicine

## 2019-04-18 ENCOUNTER — Other Ambulatory Visit: Payer: Self-pay

## 2019-04-18 ENCOUNTER — Ambulatory Visit: Payer: Managed Care, Other (non HMO) | Admitting: Family Medicine

## 2019-04-18 ENCOUNTER — Encounter: Payer: Self-pay | Admitting: Family Medicine

## 2019-04-18 VITALS — BP 144/78 | HR 84 | Temp 97.2°F | Resp 16 | Ht 65.0 in | Wt 207.0 lb

## 2019-04-18 DIAGNOSIS — N390 Urinary tract infection, site not specified: Secondary | ICD-10-CM

## 2019-04-18 DIAGNOSIS — R319 Hematuria, unspecified: Secondary | ICD-10-CM

## 2019-04-18 LAB — URINALYSIS, ROUTINE W REFLEX MICROSCOPIC
Bacteria, UA: NONE SEEN /HPF
Bilirubin Urine: NEGATIVE
Glucose, UA: NEGATIVE
Hyaline Cast: NONE SEEN /LPF
Ketones, ur: NEGATIVE
Leukocytes,Ua: NEGATIVE
Nitrite: NEGATIVE
Protein, ur: NEGATIVE
Specific Gravity, Urine: 1.01 (ref 1.001–1.03)
WBC, UA: NONE SEEN /HPF (ref 0–5)
pH: 6 (ref 5.0–8.0)

## 2019-04-18 LAB — MICROSCOPIC MESSAGE

## 2019-04-18 MED ORDER — SULFAMETHOXAZOLE-TRIMETHOPRIM 800-160 MG PO TABS: 1.0000 | ORAL_TABLET | Freq: Two times a day (BID) | ORAL | 0 refills | Status: DC

## 2019-04-18 MED ORDER — PHENAZOPYRIDINE HCL 100 MG PO TABS: 100.0000 mg | ORAL_TABLET | Freq: Three times a day (TID) | ORAL | 0 refills | Status: DC | PRN

## 2019-04-18 NOTE — Progress Notes (Signed)
   Subjective:    Patient ID: Leslie May, female    DOB: [birth year], 58 y.o.   MRN: [id number]  Patient presents for Back pain, lower abd pain - ?UTI  Pt here with symptoms similar to her previous UTI. Low back discomfort, pressure over bladder, urinary frequency and dysuria that started over the weekend. She has been drinking water and cranberry but stopped the cranberry due to elevated sugars. No change in bowels, no vaginal symptoms, no abd vaginal bleeding  No fever, no N/V. No back injury, no radiation of symptoms from lower back      Review Of Systems:  GEN- denies fatigue, fever, weight loss,weakness, recent illness HEENT- denies eye drainage, change in vision, nasal discharge, CVS- denies chest pain, palpitations RESP- denies SOB, cough, wheeze ABD- denies N/V, change in stools, abd pain GU-+ dysuria, hematuria, dribbling, incontinence MSK- + joint pain, muscle aches, injury Neuro- denies headache, dizziness, syncope, seizure activity       Objective:    BP (!) 144/78   Pulse 84   Temp (!) 97.2 F (36.2 C) (Other (Comment))   Resp 16   Ht 5\' 5"  (1.651 m)   Wt 207 lb (93.9 kg)   SpO2 97%   BMI 34.45 kg/m  GEN- NAD, alert and oriented x3 CVS- RRR, no murmur RESP-CTAB ABD-NABS,soft, to palpation suprapubic region no rebound no guarding nondistended no CVA tenderness. MSK  mild tenderness to palpation bilateral lower  paraspinal spine nontender, range of motion negative straight leg raise EXT- No edema Pulses- Radial, DP- 2+        Assessment & Plan:      Problem List Items Addressed This Visit    None    Visit Diagnoses    Urinary tract infection with hematuria, site unspecified    -  Primary   Treat with bactrim BID x 7 days, pyridium, no red flags on exam   Relevant Medications   sulfamethoxazole-trimethoprim (BACTRIM DS) 800-160 MG tablet   phenazopyridine (PYRIDIUM) 100 MG tablet   Other Relevant Orders   Urinalysis, Routine w reflex  microscopic (Completed)      Note: This dictation was prepared with Dragon dictation along with smaller phrase technology. Any transcriptional errors that result from this process are unintentional.

## 2019-04-18 NOTE — Patient Instructions (Signed)
F/u AS NEEDED  

## 2019-05-05 ENCOUNTER — Other Ambulatory Visit: Payer: Self-pay | Admitting: Family Medicine

## 2019-10-19 LAB — HM DIABETES EYE EXAM

## 2019-10-24 ENCOUNTER — Ambulatory Visit: Payer: Managed Care, Other (non HMO) | Admitting: Family Medicine

## 2019-10-24 ENCOUNTER — Encounter: Payer: Self-pay | Admitting: Family Medicine

## 2019-10-24 ENCOUNTER — Other Ambulatory Visit: Payer: Self-pay

## 2019-10-24 VITALS — BP 136/84 | HR 66 | Temp 98.1°F | Resp 14 | Ht 65.0 in | Wt 205.0 lb

## 2019-10-24 DIAGNOSIS — E66811 Obesity, class 1: Secondary | ICD-10-CM

## 2019-10-24 DIAGNOSIS — E6609 Other obesity due to excess calories: Secondary | ICD-10-CM

## 2019-10-24 DIAGNOSIS — I10 Essential (primary) hypertension: Secondary | ICD-10-CM | POA: Diagnosis not present

## 2019-10-24 DIAGNOSIS — Z6834 Body mass index (BMI) 34.0-34.9, adult: Secondary | ICD-10-CM

## 2019-10-24 DIAGNOSIS — E119 Type 2 diabetes mellitus without complications: Secondary | ICD-10-CM

## 2019-10-24 DIAGNOSIS — E781 Pure hyperglyceridemia: Secondary | ICD-10-CM

## 2019-10-24 DIAGNOSIS — N393 Stress incontinence (female) (male): Secondary | ICD-10-CM

## 2019-10-24 DIAGNOSIS — M542 Cervicalgia: Secondary | ICD-10-CM

## 2019-10-24 LAB — URINALYSIS, ROUTINE W REFLEX MICROSCOPIC
Bacteria, UA: NONE SEEN /HPF
Bilirubin Urine: NEGATIVE
Glucose, UA: NEGATIVE
Hyaline Cast: NONE SEEN /LPF
Ketones, ur: NEGATIVE
Leukocytes,Ua: NEGATIVE
Nitrite: NEGATIVE
Protein, ur: NEGATIVE
Specific Gravity, Urine: 1.015 (ref 1.001–1.03)
WBC, UA: NONE SEEN /HPF (ref 0–5)
pH: 7 (ref 5.0–8.0)

## 2019-10-24 LAB — MICROSCOPIC MESSAGE

## 2019-10-24 NOTE — Assessment & Plan Note (Signed)
Controlled nochanges Check renal function Needs lipid at next visit fasting

## 2019-10-24 NOTE — Patient Instructions (Signed)
F/U 6 months for Physical  Get xray done at [facility] - go to Radiology department Use topical antiinflammatory

## 2019-10-24 NOTE — Assessment & Plan Note (Signed)
Diet controlled Due for repeat A1C today Fasting overall look good
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
## 2019-10-25 LAB — COMPREHENSIVE METABOLIC PANEL
AG Ratio: 1.4 (calc) (ref 1.0–2.5)
ALT: 18 U/L (ref 6–29)
AST: 15 U/L (ref 10–35)
Albumin: 4.2 g/dL (ref 3.6–5.1)
Alkaline phosphatase (APISO): 71 U/L (ref 37–153)
BUN: 17 mg/dL (ref 7–25)
CO2: 26 mmol/L (ref 20–32)
Calcium: 9.9 mg/dL (ref 8.6–10.4)
Chloride: 105 mmol/L (ref 98–110)
Creat: 0.79 mg/dL (ref 0.50–1.05)
Globulin: 2.9 g/dL (calc) (ref 1.9–3.7)
Glucose, Bld: 133 mg/dL — ABNORMAL HIGH (ref 65–99)
Potassium: 4 mmol/L (ref 3.5–5.3)
Sodium: 140 mmol/L (ref 135–146)
Total Bilirubin: 0.4 mg/dL (ref 0.2–1.2)
Total Protein: 7.1 g/dL (ref 6.1–8.1)

## 2019-10-25 LAB — MICROALBUMIN / CREATININE URINE RATIO
Creatinine, Urine: 38 mg/dL (ref 20–275)
Microalb, Ur: <0.2 mg/dL

## 2019-10-25 LAB — CBC WITH DIFFERENTIAL/PLATELET
Absolute Monocytes: 754 cells/uL (ref 200–950)
Basophils Absolute: 58 cells/uL (ref 0–200)
Basophils Relative: 0.5 %
Eosinophils Absolute: 128 cells/uL (ref 15–500)
Eosinophils Relative: 1.1 %
HCT: 48.6 % — ABNORMAL HIGH (ref 35.0–45.0)
Hemoglobin: 16.3 g/dL — ABNORMAL HIGH (ref 11.7–15.5)
Lymphs Abs: 4420 cells/uL — ABNORMAL HIGH (ref 850–3900)
MCH: 30 pg (ref 27.0–33.0)
MCHC: 33.5 g/dL (ref 32.0–36.0)
MCV: 89.5 fL (ref 80.0–100.0)
MPV: 11.1 fL (ref 7.5–12.5)
Monocytes Relative: 6.5 %
Neutro Abs: 6241 cells/uL (ref 1500–7800)
Neutrophils Relative %: 53.8 %
Platelets: 253 10*3/uL (ref 140–400)
RBC: 5.43 10*6/uL — ABNORMAL HIGH (ref 3.80–5.10)
RDW: 12.5 % (ref 11.0–15.0)
Total Lymphocyte: 38.1 %
WBC: 11.6 10*3/uL — ABNORMAL HIGH (ref 3.8–10.8)

## 2019-10-25 LAB — HEMOGLOBIN A1C
Hgb A1c MFr Bld: 5.8 % of total Hgb — ABNORMAL HIGH (ref <5.7)
Mean Plasma Glucose: 120 (calc)
eAG (mmol/L): 6.6 (calc)

## 2019-10-26 ENCOUNTER — Ambulatory Visit (HOSPITAL_COMMUNITY)
Admission: RE | Admit: 2019-10-26 | Discharge: 2019-10-26 | Disposition: A | Discharge status: Home / Self Care | Payer: Managed Care, Other (non HMO) | Source: Ambulatory Visit | Attending: Family Medicine | Admitting: Family Medicine

## 2019-10-26 ENCOUNTER — Other Ambulatory Visit: Payer: Self-pay

## 2019-10-26 DIAGNOSIS — M542 Cervicalgia: Secondary | ICD-10-CM | POA: Diagnosis present

## 2019-10-26 DIAGNOSIS — Y929 Unspecified place or not applicable: Secondary | ICD-10-CM | POA: Diagnosis not present

## 2019-11-03 ENCOUNTER — Other Ambulatory Visit: Payer: Self-pay | Admitting: Family Medicine

## 2019-11-05 ENCOUNTER — Other Ambulatory Visit: Payer: Self-pay

## 2019-11-05 MED ORDER — VERAPAMIL HCL ER 300 MG PO CP24: 1.0000 | ORAL_CAPSULE | Freq: Every day | ORAL | 2 refills | Status: DC

## 2019-11-05 MED ORDER — OLMESARTAN MEDOXOMIL 20 MG PO TABS: 10.0000 mg | ORAL_TABLET | Freq: Every day | ORAL | 2 refills | Status: DC

## 2019-11-15 ENCOUNTER — Encounter: Payer: Self-pay | Admitting: *Deleted

## 2019-12-26 ENCOUNTER — Other Ambulatory Visit: Payer: Self-pay | Admitting: Nurse Practitioner

## 2019-12-26 ENCOUNTER — Telehealth: Payer: Managed Care, Other (non HMO) | Admitting: Family Medicine

## 2019-12-26 ENCOUNTER — Telehealth: Payer: Self-pay | Admitting: Nurse Practitioner

## 2019-12-26 ENCOUNTER — Encounter: Payer: Self-pay | Admitting: Family Medicine

## 2019-12-26 ENCOUNTER — Other Ambulatory Visit: Payer: Self-pay

## 2019-12-26 DIAGNOSIS — J988 Other specified respiratory disorders: Secondary | ICD-10-CM

## 2019-12-26 DIAGNOSIS — U071 COVID-19: Secondary | ICD-10-CM | POA: Diagnosis not present

## 2019-12-26 DIAGNOSIS — E119 Type 2 diabetes mellitus without complications: Secondary | ICD-10-CM

## 2019-12-26 DIAGNOSIS — I10 Essential (primary) hypertension: Secondary | ICD-10-CM

## 2019-12-26 MED ORDER — BENZONATATE 100 MG PO CAPS
100.0000 mg | ORAL_CAPSULE | Freq: Three times a day (TID) | ORAL | 0 refills | Status: DC | PRN
Start: 2019-12-26 — End: 2020-02-20

## 2019-12-26 MED ORDER — PREDNISONE 20 MG PO TABS
40.0000 mg | ORAL_TABLET | Freq: Every day | ORAL | 0 refills | Status: DC
Start: 2019-12-26 — End: 2020-02-20

## 2019-12-26 MED ORDER — PREDNISONE 10 MG PO TABS: ORAL_TABLET | ORAL | 0 refills | Status: DC

## 2019-12-26 NOTE — Telephone Encounter (Signed)
Called to discuss with Leslie May about Covid symptoms and the use of casirivimab/imdevimab, a combination monoclonal antibody infusion for those with mild to moderate Covid symptoms and at a high risk of hospitalization.     Pt is qualified for this infusion at the [facility] infusion center due to co-morbid conditions as indicated below. Patient reports symptom onset 12/22/19.   Patient verbalized understanding of infusion and appointment details.   Scheduled for 12/27/19 @ 1030    Patient Active Problem List   Diagnosis Date Noted   Anxiety and depression 11/29/2018   Diabetes mellitus ([place]) 07/07/2015   Hypertriglyceridemia 07/07/2015   Routine general medical examination at a health care facility 95/39/6728   Systolic murmur 97/91/5041   Post-menopausal bleeding 08/10/2011   Essential hypertension 08/10/2011   Obesity 08/10/2011   Tobacco use 08/10/2011    Alda Lea, AGPCNP-BC

## 2019-12-26 NOTE — Progress Notes (Signed)
I connected by phone with Leslie May on 12/26/2019 at 6:46 PM to discuss the potential use of a new treatment for mild to moderate COVID-19 viral infection in non-hospitalized patients.  This patient is a 59 y.o. female that meets the FDA criteria for Emergency Use Authorization of COVID monoclonal antibody casirivimab/imdevimab.  Has a (+) direct SARS-CoV-2 viral test result  Has mild or moderate COVID-19   Is NOT hospitalized due to COVID-19  Is within 10 days of symptom onset  Has at least one of the high risk factor(s) for progression to severe COVID-19 and/or hospitalization as defined in EUA.  Specific high risk criteria : Diabetes   I have spoken and communicated the following to the patient or parent/caregiver regarding COVID monoclonal antibody treatment:  1. FDA has authorized the emergency use for the treatment of mild to moderate COVID-19 in adults and pediatric patients with positive results of direct SARS-CoV-2 viral testing who are 33 years of age and older weighing at least 40 kg, and who are at high risk for progressing to severe COVID-19 and/or hospitalization.  2. The significant known and potential risks and benefits of COVID monoclonal antibody, and the extent to which such potential risks and benefits are unknown.  3. Information on available alternative treatments and the risks and benefits of those alternatives, including clinical trials.  4. Patients treated with COVID monoclonal antibody should continue to self-isolate and use infection control measures (e.g., wear mask, isolate, social distance, avoid sharing personal items, clean and disinfect "high touch" surfaces, and frequent handwashing) according to CDC guidelines.   5. The patient or parent/caregiver has the option to accept or refuse COVID monoclonal antibody treatment.  After reviewing this information with the patient, The patient agreed to proceed with receiving casirivimab\imdevimab infusion and  will be provided a copy of the Fact sheet prior to receiving the infusion. Jobe Gibbon 12/26/2019 6:46 PM

## 2019-12-26 NOTE — Progress Notes (Signed)
Virtual Visit via Telephone Note  I connected with Leslie May on 12/26/19 at 10:29AM by telephone and verified that I am speaking with the correct person using two identifiers.      Pt location: at home   Physician location:  In office, [facility], Vic Blackbird MD     On call: patient and physician   I discussed the limitations, risks, security and privacy concerns of performing an evaluation and management service by telephone and the availability of in person appointments. I also discussed with the patient that there may be a patient responsible charge related to this service. The patient expressed understanding and agreed to proceed.  Note patient unable to get her video connected History of Present Illness:  Patient positive for COVID-19 on Saturday 14th at [facility].Marland Kitchen Her grand-daughter who is school age tested positive  She has not been vaccinated  Initially had headache chills, that has improved, currently has muscle aches , nasal congestion, fatigue She  had some blisters on feet- small clear fluid, then they went away but has pain in feet  No swollen joints  She has diarrhea  No loss of taste or smell, occ wheezing but doesn't feel SOB, cough is intermittent with production   Husband also sick    Observations/Objective: NAD noted , speaking in full sentences , normal WOB on phone  Assessment and Plan: COVID-19 infection.  She is high risk as she has history of diabetes mellitus hypertension and she is a smoker.  She does have some bronchospasm.  I offered prednisone along with albuterol inhaler.  She does not like to use the albuterol because of how it makes her feel.  She agreed to do the prednisone.  I also sent Tessalon Perles for cough.  Discussed red flags and when to seek emergency care.  I think she will be a good candidate for Monoclonal Antibody infusion so I will refer her name over    Follow Up Instructions:    I discussed the  assessment and treatment plan with the patient. The patient was provided an opportunity to ask questions and all were answered. The patient agreed with the plan and demonstrated an understanding of the instructions.   The patient was advised to call back or seek an in-person evaluation if the symptoms worsen or if the condition fails to improve as anticipated.  I provided  15 minutes of non-face-to-face time during this encounter. End Time: 10:44am   Vic Blackbird, MD

## 2019-12-27 ENCOUNTER — Ambulatory Visit (HOSPITAL_COMMUNITY)
Admission: RE | Admit: 2019-12-27 | Discharge: 2019-12-27 | Disposition: A | Discharge status: Home / Self Care | Payer: Managed Care, Other (non HMO) | Source: Ambulatory Visit | Attending: Pulmonary Disease | Admitting: Pulmonary Disease

## 2019-12-27 DIAGNOSIS — E119 Type 2 diabetes mellitus without complications: Secondary | ICD-10-CM | POA: Insufficient documentation

## 2019-12-27 DIAGNOSIS — U071 COVID-19: Secondary | ICD-10-CM | POA: Insufficient documentation

## 2019-12-27 DIAGNOSIS — I10 Essential (primary) hypertension: Secondary | ICD-10-CM | POA: Insufficient documentation

## 2019-12-27 MED ORDER — SODIUM CHLORIDE 0.9 % IV SOLN
1200.0000 mg | Freq: Once | INTRAVENOUS | Status: AC
  Administered 2019-12-27: 1200 mg via INTRAVENOUS
  Filled 2019-12-27: qty 10

## 2019-12-27 MED ORDER — METHYLPREDNISOLONE SODIUM SUCC 125 MG IJ SOLR: 125.0000 mg | Freq: Once | INTRAMUSCULAR | Status: DC | PRN

## 2019-12-27 MED ORDER — DIPHENHYDRAMINE HCL 50 MG/ML IJ SOLN: 50.0000 mg | Freq: Once | INTRAMUSCULAR | Status: DC | PRN

## 2019-12-27 MED ORDER — SODIUM CHLORIDE 0.9 % IV SOLN: INTRAVENOUS | Status: DC | PRN

## 2019-12-27 MED ORDER — ALBUTEROL SULFATE HFA 108 (90 BASE) MCG/ACT IN AERS: 2.0000 | INHALATION_SPRAY | Freq: Once | RESPIRATORY_TRACT | Status: DC | PRN

## 2019-12-27 MED ORDER — EPINEPHRINE 0.3 MG/0.3ML IJ SOAJ: 0.3000 mg | Freq: Once | INTRAMUSCULAR | Status: DC | PRN

## 2019-12-27 MED ORDER — FAMOTIDINE IN NACL 20-0.9 MG/50ML-% IV SOLN: 20.0000 mg | Freq: Once | INTRAVENOUS | Status: DC | PRN

## 2019-12-27 NOTE — Discharge Instructions (Signed)

## 2019-12-27 NOTE — Progress Notes (Signed)
  Diagnosis: COVID-19  Physician:Dr Joya Gaskins  Procedure: [facility] Infusion [facility] Med: casirivimab\imdevimab infusion - Provided patient with casirivimab\imdevimab fact sheet for patients, parents and caregivers prior to infusion.  Complications: No immediate complications noted.  Discharge: Discharged home   [place], [place] 12/27/2019

## 2020-02-20 ENCOUNTER — Other Ambulatory Visit: Payer: Self-pay

## 2020-02-20 ENCOUNTER — Ambulatory Visit: Payer: Managed Care, Other (non HMO) | Admitting: Family Medicine

## 2020-02-20 ENCOUNTER — Encounter: Payer: Self-pay | Admitting: Family Medicine

## 2020-02-20 VITALS — BP 142/76 | HR 81 | Temp 98.0°F | Resp 12 | Ht 63.0 in | Wt 202.0 lb

## 2020-02-20 DIAGNOSIS — F17200 Nicotine dependence, unspecified, uncomplicated: Secondary | ICD-10-CM | POA: Diagnosis not present

## 2020-02-20 DIAGNOSIS — N3001 Acute cystitis with hematuria: Secondary | ICD-10-CM | POA: Diagnosis not present

## 2020-02-20 DIAGNOSIS — R319 Hematuria, unspecified: Secondary | ICD-10-CM

## 2020-02-20 LAB — URINALYSIS, ROUTINE W REFLEX MICROSCOPIC
Bacteria, UA: NONE SEEN /HPF
Bilirubin Urine: NEGATIVE
Glucose, UA: NEGATIVE
Hyaline Cast: NONE SEEN /LPF
Ketones, ur: NEGATIVE
Leukocytes,Ua: NEGATIVE
Nitrite: NEGATIVE
Protein, ur: NEGATIVE
Specific Gravity, Urine: 1.01 (ref 1.001–1.03)
WBC, UA: NONE SEEN /HPF (ref 0–5)
pH: 6.5 (ref 5.0–8.0)

## 2020-02-20 LAB — MICROSCOPIC MESSAGE

## 2020-02-20 MED ORDER — FLUCONAZOLE 100 MG PO TABS: 100.0000 mg | ORAL_TABLET | Freq: Every day | ORAL | 0 refills | Status: DC

## 2020-02-20 MED ORDER — NITROFURANTOIN MONOHYD MACRO 100 MG PO CAPS: 100.0000 mg | ORAL_CAPSULE | Freq: Two times a day (BID) | ORAL | 0 refills | Status: DC

## 2020-02-20 NOTE — Patient Instructions (Signed)
Try Amberon Take antibiotics as prescribed Work on the smoking Referral to urology  F/U 3-4 MONTHS

## 2020-02-20 NOTE — Progress Notes (Signed)
   Subjective:    Patient ID: Leslie May, female    DOB: 06-14-1960, 59 y.o.   MRN: [id number]  Patient presents for Back Pain (Pt said that since Thursday she has had lower back pain and urinary frequency. She has burning with urination as well. Trying to drink a lot of water but no improvement. Has been working out too and worried she may have hurt something. )  Pt here with urinary frequency, back pain that started last Thursday. She noticed when she would lay down on back , the left spasm, she was going very frequently with the , burning sensation and pressure  No fever  No blood in his urineurine   She had COVID-19 in August, she had infusion and is feeling better  she is still smoking  Working out [place]   Past 3 Urology - she has trace blood with, she had cystocopy done as a child arpund age 47 year   Review Of Systems:  GEN- denies fatigue, fever, weight loss,weakness, recent illness HEENT- denies eye drainage, change in vision, nasal discharge, CVS- denies chest pain, palpitations RESP- denies SOB, cough, wheeze ABD- denies N/V, change in stools, abd pain GU- + dysuria, hematuria, dribbling, incontinence MSK- + joint pain, muscle aches, injury Neuro- denies headache, dizziness, syncope, seizure activity       Objective:    BP (!) 142/76   Pulse 81   Temp 98 F (36.7 C)   Resp 12   Ht 5\' 3"  (1.6 m)   Wt 202 lb (91.6 kg)   SpO2 99%   BMI 35.78 kg/m  GEN- NAD, alert and oriented x3 HEENT- PERRL, EOMI, non injected sclera, pink conjunctiva, MMM, oropharynx clear Neck- Supple, no LAD  CVS- RRR, no murmur RESP-CTAB ABD-NABS,soft,TTP Suprapubic region, no CVA tenderness MSK- mild lower TTP lumbar spine ,fair ROM  EXT- No edema Pulses- Radial 2+        Assessment & Plan:      Problem List Items Addressed This Visit    None    Visit Diagnoses    Acute cystitis with hematuria    -  Primary   Pt sympatomtic, given macrobid x 5 days, with each UA  often has blood, has something urological as a child, she is also a smoker so higher risk for bladder CA, urology referral  She does have underlying DDD so this could also be causing some her pain    Relevant Orders   Urinalysis, Routine w reflex microscopic (Completed)   Urine Culture   Hematuria, unspecified type       Relevant Orders   Ambulatory referral to Urology   Smoker    - Counseled on cessation    Relevant Orders   Ambulatory referral to Urology      Note: This dictation was prepared with Dragon dictation along with smaller phrase technology. Any transcriptional errors that result from this process are unintentional.

## 2020-03-17 ENCOUNTER — Other Ambulatory Visit: Payer: Self-pay | Admitting: Family Medicine

## 2020-04-08 ENCOUNTER — Ambulatory Visit: Payer: Managed Care, Other (non HMO) | Admitting: Urology

## 2020-06-06 ENCOUNTER — Ambulatory Visit: Payer: Managed Care, Other (non HMO) | Admitting: Family Medicine

## 2020-06-10 ENCOUNTER — Ambulatory Visit: Payer: Managed Care, Other (non HMO) | Admitting: Family Medicine

## 2020-07-02 ENCOUNTER — Encounter: Payer: Self-pay | Admitting: Family Medicine

## 2020-07-02 ENCOUNTER — Other Ambulatory Visit: Payer: Self-pay

## 2020-07-02 ENCOUNTER — Ambulatory Visit: Payer: Managed Care, Other (non HMO) | Admitting: Family Medicine

## 2020-07-02 VITALS — BP 140/86 | HR 75 | Temp 97.9°F | Ht 63.0 in | Wt 201.0 lb

## 2020-07-02 DIAGNOSIS — Z720 Tobacco use: Secondary | ICD-10-CM | POA: Diagnosis not present

## 2020-07-02 DIAGNOSIS — I10 Essential (primary) hypertension: Secondary | ICD-10-CM

## 2020-07-02 DIAGNOSIS — E119 Type 2 diabetes mellitus without complications: Secondary | ICD-10-CM | POA: Diagnosis not present

## 2020-07-02 DIAGNOSIS — Z6835 Body mass index (BMI) 35.0-35.9, adult: Secondary | ICD-10-CM

## 2020-07-02 DIAGNOSIS — E781 Pure hyperglyceridemia: Secondary | ICD-10-CM

## 2020-07-02 MED ORDER — NALTREXONE-BUPROPION HCL ER 8-90 MG PO TB12: ORAL_TABLET | ORAL | 1 refills | Status: DC

## 2020-07-02 NOTE — Patient Instructions (Addendum)
F/u Leslie May 4 weeks Start contrave 1 tablet once a day in the morning for the first week Then increase to 1 tablet twice a day

## 2020-07-02 NOTE — Assessment & Plan Note (Signed)
Blood pressure is mildly elevated today.  Continue current medication she will monitor at home reducing sodium in diet increasing water intake.  Weight loss will improve her blood pressure and other comorbidities.

## 2020-07-02 NOTE — Assessment & Plan Note (Signed)
Recheck A1c.  She did have GI side effects with Metformin.  She is reluctant to use any type of injectable medication.  Obesity we will see begin again Contrave covered.  She is a smoker so the Wellbutrin should help with those cravings.  She will start with 1 tablet once a day then go to 1 tablet twice a day follow-up in the office in 4 weeks for reevaluation of medication.  Fasting labs obtained today.

## 2020-07-02 NOTE — Progress Notes (Signed)
   Subjective:    Patient ID: Leslie May, female    DOB: 03/19/1961, 60 y.o.   MRN: [id number]  Patient presents for Hypertension and Diabetes Here to follow-up chronic medical problems. Medications reviewed. Hypertension she is taking her blood pressure medicine as prescribed no side effects with the medication  Obesity- joined [employer] , sheis doing cardio and resistance training  She doesn't like veggies, only eat potates,green beans and corn moslty  She continues to struggle with her weight.  She is not following weight watchers any longer.  She does have cravings and would like to try medication to help.  He had recent visit with her OB/GYN had mammogram done. Review Of Systems:  GEN- denies fatigue, fever, weight loss,weakness, recent illness HEENT- denies eye drainage, change in vision, nasal discharge, CVS- denies chest pain, palpitations RESP- denies SOB, cough, wheeze ABD- denies N/V, change in stools, abd pain GU- denies dysuria, hematuria, dribbling, incontinence MSK- denies joint pain, muscle aches, injury Neuro- denies headache, dizziness, syncope, seizure activity       Objective:    BP 140/86   Pulse 75   Temp 97.9 F (36.6 C)   Ht 5\' 3"  (1.6 m)   Wt 201 lb (91.2 kg)   SpO2 96%   BMI 35.61 kg/m  GEN- NAD, alert and oriented x3 HEENT- PERRL, EOMI, non injected sclera, pink conjunctiva, MMM, oropharynx clear Neck- Supple, no thyromegaly CVS- RRR, systolic murmur RESP-CTAB ABD-NABS,soft,NT,ND EXT- No edema Pulses- Radial, DP- 2+        Assessment & Plan:      Problem List Items Addressed This Visit      Unprioritized   Diabetes mellitus ([facility])    Recheck A1c.  She did have GI side effects with Metformin.  She is reluctant to use any type of injectable medication.  Obesity we will see begin again Contrave covered.  She is a smoker so the Wellbutrin should help with those cravings.  She will start with 1 tablet once a day then go to 1  tablet twice a day follow-up in the office in 4 weeks for reevaluation of medication.  Fasting labs obtained today.      Essential hypertension - Primary    Blood pressure is mildly elevated today.  Continue current medication she will monitor at home reducing sodium in diet increasing water intake.  Weight loss will improve her blood pressure and other comorbidities.      Relevant Orders   CBC with Differential/Platelet   Comprehensive metabolic panel   Lipid panel   Hemoglobin A1c   Hypertriglyceridemia   Relevant Orders   Lipid panel   Obesity   Relevant Medications   Naltrexone-buPROPion HCl ER 8-90 MG TB12   Tobacco use      Note: This dictation was prepared with Dragon dictation along with smaller phrase technology. Any transcriptional errors that result from this process are unintentional.

## 2020-07-03 LAB — CBC WITH DIFFERENTIAL/PLATELET
Absolute Monocytes: 632 cells/uL (ref 200–950)
Basophils Absolute: 84 cells/uL (ref 0–200)
Basophils Relative: 0.9 %
Eosinophils Absolute: 112 cells/uL (ref 15–500)
Eosinophils Relative: 1.2 %
HCT: 48 % — ABNORMAL HIGH (ref 35.0–45.0)
Hemoglobin: 16.7 g/dL — ABNORMAL HIGH (ref 11.7–15.5)
Lymphs Abs: 4297 cells/uL — ABNORMAL HIGH (ref 850–3900)
MCH: 31.2 pg (ref 27.0–33.0)
MCHC: 34.8 g/dL (ref 32.0–36.0)
MCV: 89.7 fL (ref 80.0–100.0)
MPV: 10.9 fL (ref 7.5–12.5)
Monocytes Relative: 6.8 %
Neutro Abs: 4176 cells/uL (ref 1500–7800)
Neutrophils Relative %: 44.9 %
Platelets: 254 10*3/uL (ref 140–400)
RBC: 5.35 10*6/uL — ABNORMAL HIGH (ref 3.80–5.10)
RDW: 12.3 % (ref 11.0–15.0)
Total Lymphocyte: 46.2 %
WBC: 9.3 10*3/uL (ref 3.8–10.8)

## 2020-07-03 LAB — COMPREHENSIVE METABOLIC PANEL
AG Ratio: 1.4 (calc) (ref 1.0–2.5)
ALT: 18 U/L (ref 6–29)
AST: 15 U/L (ref 10–35)
Albumin: 4.3 g/dL (ref 3.6–5.1)
Alkaline phosphatase (APISO): 71 U/L (ref 37–153)
BUN: 20 mg/dL (ref 7–25)
CO2: 25 mmol/L (ref 20–32)
Calcium: 10.3 mg/dL (ref 8.6–10.4)
Chloride: 106 mmol/L (ref 98–110)
Creat: 0.74 mg/dL (ref 0.50–1.05)
Globulin: 3 g/dL (calc) (ref 1.9–3.7)
Glucose, Bld: 100 mg/dL — ABNORMAL HIGH (ref 65–99)
Potassium: 4.4 mmol/L (ref 3.5–5.3)
Sodium: 140 mmol/L (ref 135–146)
Total Bilirubin: 0.4 mg/dL (ref 0.2–1.2)
Total Protein: 7.3 g/dL (ref 6.1–8.1)

## 2020-07-03 LAB — LIPID PANEL
Cholesterol: 194 mg/dL (ref <200)
HDL: 32 mg/dL — ABNORMAL LOW (ref >=50)
LDL Cholesterol (Calc): 118 mg/dL (calc) — ABNORMAL HIGH
Non-HDL Cholesterol (Calc): 162 mg/dL (calc) — ABNORMAL HIGH (ref <130)
Total CHOL/HDL Ratio: 6.1 (calc) — ABNORMAL HIGH (ref <5.0)
Triglycerides: 309 mg/dL — ABNORMAL HIGH (ref <150)

## 2020-07-03 LAB — HEMOGLOBIN A1C
Hgb A1c MFr Bld: 5.8 % of total Hgb — ABNORMAL HIGH (ref <5.7)
Mean Plasma Glucose: 120 mg/dL
eAG (mmol/L): 6.6 mmol/L

## 2020-07-17 NOTE — Progress Notes (Signed)
Pt says she will not be taking the lipitor nor is she any longer taking the weight loss med due to how it made her feel

## 2020-08-05 ENCOUNTER — Ambulatory Visit (INDEPENDENT_AMBULATORY_CARE_PROVIDER_SITE_OTHER): Payer: Managed Care, Other (non HMO) | Admitting: Nurse Practitioner

## 2020-08-05 ENCOUNTER — Other Ambulatory Visit: Payer: Self-pay

## 2020-08-05 ENCOUNTER — Encounter: Payer: Self-pay | Admitting: Nurse Practitioner

## 2020-08-05 DIAGNOSIS — Z6835 Body mass index (BMI) 35.0-35.9, adult: Secondary | ICD-10-CM | POA: Diagnosis not present

## 2020-08-05 DIAGNOSIS — E782 Mixed hyperlipidemia: Secondary | ICD-10-CM | POA: Diagnosis not present

## 2020-08-05 DIAGNOSIS — Z13220 Encounter for screening for lipoid disorders: Secondary | ICD-10-CM

## 2020-08-05 LAB — LIPID PANEL
Cholesterol: 189 mg/dL (ref <200)
HDL: 30 mg/dL — ABNORMAL LOW (ref >=50)
LDL Cholesterol (Calc): 119 mg/dL (calc) — ABNORMAL HIGH
Non-HDL Cholesterol (Calc): 159 mg/dL (calc) — ABNORMAL HIGH (ref <130)
Total CHOL/HDL Ratio: 6.3 (calc) — ABNORMAL HIGH (ref <5.0)
Triglycerides: 282 mg/dL — ABNORMAL HIGH (ref <150)

## 2020-08-05 NOTE — Progress Notes (Signed)
Subjective:    Patient ID: Leslie May, female    DOB: 03-28-1961, 60 y.o.   MRN: [id number]  HPI: Leslie May is a 60 y.o. female presenting for follow up.  Chief Complaint  Patient presents with  . Follow-up    Weight loss (stopped contrave) wants to talk about another med for weight loss, smoking cessation (no longer taking the wellbutrin), wants fasting labs   WEIGHT GAIN Baby sits grandchildren - could not tolerate Contrave - felt very "out of it.  " Duration: chronic Previous attempts at weight loss: yes Complications of obesity: prediabetes Peak weight: 260 lbs - lost 60 lbs with Weight Watchers Weight loss goal: 150 lbs Gym: goes to gym 3x week, do weight bearing; rides bicycle  Diet: avoids carbs, limits sweets, does not like vegetables but does eat spinach, green beans.  Water intake: 50-60 oz  Weight loss to date: ~ 60 lbs Requesting obesity pharmacotherapy: yes Current weight loss supplements/medications: no Previous weight loss supplements/meds: yes  HYPERLIPIDEMIA Worried about weight gain affect on heart disease.  Her father had a heart attack in his 47s and had multiple bypasses.  She reports she was previously told to start taking a statin, she did not do this because she is worried about the side effects. Hyperlipidemia status: uncontrolled. Satisfied with current treatment?  no Side effects:  Not currently taking treatment Medication compliance: not currently taking treatment Past cholesterol meds: fish oil Aspirin:  yes The 10-year ASCVD risk score Mikey Bussing DC Jr., et al., 2013) is: 26%   Values used to calculate the score:     Age: 54 years     Sex: Female     Is Non-Hispanic African American: No     Diabetic: Yes     Tobacco smoker: Yes     Systolic Blood Pressure: 270 mmHg     Is BP treated: Yes     HDL Cholesterol: 30 mg/dL     Total Cholesterol: 189 mg/dL Chest pain:  no Coronary artery disease:  no Family history CAD:  yes Family  history early CAD:  yes  Allergies  Allergen Reactions  . Ezetimibe   . Hctz [Hydrochlorothiazide] Itching  . Norvasc [Amlodipine Besylate] Swelling    Throat swelling  . Penicillins Swelling    Throat swelling    Outpatient Encounter Medications as of 08/05/2020  Medication Sig  . aspirin 81 MG tablet Take 81 mg by mouth daily.  . cholecalciferol (VITAMIN D) 1000 units tablet Take 5,000 Units by mouth daily.   . Cinnamon 500 MG TABS Take by mouth.  . Cyanocobalamin (VITAMIN B-12) 5000 MCG TBDP Take by mouth.  . Garlic 3500 MG CAPS Take by mouth.  . Multiple Vitamin (MULTIVITAMIN WITH MINERALS) TABS tablet Take 1 tablet by mouth daily. Menopause Support formulation  . Naltrexone-buPROPion HCl ER 8-90 MG TB12 Start 1 tablet every morning for 7 days, then 1 tablet twice daily  . olmesartan (BENICAR) 20 MG tablet TAKE 1/2 TABLET BY MOUTH DAILY  . Omega-3 Fatty Acids (FISH OIL) 1000 MG CAPS (1) cap PO BID (Patient taking differently: Take 1 capsule by mouth 2 (two) times daily.)  . omeprazole (PRILOSEC) 20 MG capsule TAKE ONE CAPSULE BY MOUTH DAILY  . Verapamil HCl CR 300 MG CP24 TAKE ONE CAPSULE BY MOUTH DAILY   No facility-administered encounter medications on file as of 08/05/2020.    Patient Active Problem List   Diagnosis Date Noted  . Anxiety and depression 11/29/2018  .  Diabetes mellitus ([place]) 07/07/2015  . Hyperlipidemia 07/07/2015  . Routine general medical examination at a health care facility 10/22/2013  . Systolic murmur 33/00/7622  . Post-menopausal bleeding 08/10/2011  . Essential hypertension 08/10/2011  . Severe obesity (BMI 35.0-35.9 with comorbidity) ([place]) 08/10/2011  . Tobacco use 08/10/2011    Past Medical History:  Diagnosis Date  . Cancer ([place]) > 20 years ago   cervical cancer s/p cryosurgery  . Diabetes mellitus without complication ([place])   . Hypertension     Relevant past medical, surgical, family and social history reviewed and updated as  indicated. Interim medical history since our last visit reviewed.  Review of Systems Per HPI unless specifically indicated above     Objective:    BP 138/72   Pulse 72   Temp 98.6 F (37 C)   Ht 5\' 3"  (1.6 m)   Wt 199 lb (90.3 kg)   SpO2 98%   BMI 35.25 kg/m   Wt Readings from Last 3 Encounters:  08/05/20 199 lb (90.3 kg)  07/02/20 201 lb (91.2 kg)  02/20/20 202 lb (91.6 kg)    Physical Exam Vitals and nursing note reviewed.  Constitutional:      General: She is not in acute distress.    Appearance: Normal appearance. She is not toxic-appearing.  HENT:     Head: Normocephalic and atraumatic.     Right Ear: External ear normal.     Left Ear: External ear normal.  Eyes:     General: No scleral icterus.    Extraocular Movements: Extraocular movements intact.  Neck:     Vascular: No carotid bruit.  Cardiovascular:     Rate and Rhythm: Normal rate and regular rhythm.     Heart sounds: Murmur heard.    Pulmonary:     Effort: Pulmonary effort is normal. No respiratory distress.     Breath sounds: Normal breath sounds. No wheezing, rhonchi or rales.  Abdominal:     General: Abdomen is flat. Bowel sounds are normal. There is no distension.  Musculoskeletal:        General: Normal range of motion.     Cervical back: Normal range of motion.     Right lower leg: No edema.     Left lower leg: No edema.  Lymphadenopathy:     Cervical: No cervical adenopathy.  Skin:    General: Skin is warm.     Capillary Refill: Capillary refill takes less than 2 seconds.     Coloration: Skin is not jaundiced or pale.     Findings: No erythema.  Neurological:     Mental Status: She is alert and oriented to person, place, and time.     Motor: No weakness.     Gait: Gait normal.  Psychiatric:        Mood and Affect: Mood normal.        Behavior: Behavior normal.        Thought Content: Thought content normal.        Judgment: Judgment normal.        Assessment & Plan:    Problem List Items Addressed This Visit      Other   Severe obesity (BMI 35.0-35.9 with comorbidity) ([place]) - Primary    Chronic.  Did not like how Contrave made her feel.  Discussed other options for weight loss including long acting GLP-1 receptor agonists.  Patient is not interested in injectables.  Is not interested in being referred to specialty clinic for  weight management - reports she does not have time.  Encouraged continued physical activity with goal 30 minutes 5 times weekly and eating a diet rich in vegetables to help feel fuller longer.  Avoid sugary drinks/sweets.  Try to limit carbohydrates to complex carbohydrates only.  Will be available to patient should she desire referral to weight management clinic or pharmacotherapy as discussed above.       Hyperlipidemia    Chronic.  Discussed family history and most recent lipid results with patient extensively.  Given The 10-year ASCVD risk score Mikey Bussing DC Brooke Bonito., et al., 2013) is: 26%   Values used to calculate the score:     Age: 60 years     Sex: Female     Is Non-Hispanic African American: No     Diabetic: Yes     Tobacco smoker: Yes     Systolic Blood Pressure: 784 mmHg     Is BP treated: Yes     HDL Cholesterol: 30 mg/dL     Total Cholesterol: 189 mg/dL Highly recommend starting a statin to help reduce risk, especially given significant family history.  We also discussed other options including Zetia (which the patient did not tolerate in the past) as well as Repatha.  Will recheck lipids today since patient is fasting but I reiterated with her that regardless of the LDL level, I recommend a statin since she is currently smoking and because of the strong family history of early heart disease.  She verbalized understanding.  We will follow up pending lab work.      Relevant Orders   Lipid Panel (Completed)       Follow up plan: Return for pending blood work.   >30 minutes spent with the patient during this encounter.

## 2020-08-06 MED ORDER — ATORVASTATIN CALCIUM 10 MG PO TABS: 10.0000 mg | ORAL_TABLET | Freq: Every day | ORAL | 0 refills | Status: DC

## 2020-08-06 NOTE — Addendum Note (Signed)
Addended by: Amalia Hailey on: 08/06/2020 12:42 PM   Modules accepted: Orders

## 2020-08-06 NOTE — Assessment & Plan Note (Signed)
Chronic.  Discussed family history and most recent lipid results with patient extensively.  Given The 10-year ASCVD risk score Mikey Bussing DC Brooke Bonito., et al., 2013) is: 26%   Values used to calculate the score:     Age: 60 years     Sex: Female     Is Non-Hispanic African American: No     Diabetic: Yes     Tobacco smoker: Yes     Systolic Blood Pressure: 111 mmHg     Is BP treated: Yes     HDL Cholesterol: 30 mg/dL     Total Cholesterol: 189 mg/dL Highly recommend starting a statin to help reduce risk, especially given significant family history.  We also discussed other options including Zetia (which the patient did not tolerate in the past) as well as Repatha.  Will recheck lipids today since patient is fasting but I reiterated with her that regardless of the LDL level, I recommend a statin since she is currently smoking and because of the strong family history of early heart disease.  She verbalized understanding.  We will follow up pending lab work.

## 2020-08-06 NOTE — Patient Instructions (Signed)
Pending blood work

## 2020-08-06 NOTE — Progress Notes (Signed)
Medication sent.

## 2020-08-06 NOTE — Assessment & Plan Note (Addendum)
Chronic.  Did not like how Contrave made her feel.  Discussed other options for weight loss including long acting GLP-1 receptor agonists.  Patient is not interested in injectables.  Is not interested in being referred to specialty clinic for weight management - reports she does not have time.  Encouraged continued physical activity with goal 30 minutes 5 times weekly and eating a diet rich in vegetables to help feel fuller longer.  Avoid sugary drinks/sweets.  Try to limit carbohydrates to complex carbohydrates only.  Will be available to patient should she desire referral to weight management clinic or pharmacotherapy as discussed above.

## 2020-08-09 ENCOUNTER — Other Ambulatory Visit: Payer: Self-pay | Admitting: Family Medicine

## 2020-10-30 ENCOUNTER — Telehealth: Payer: Self-pay | Admitting: Nurse Practitioner

## 2020-10-30 ENCOUNTER — Other Ambulatory Visit: Payer: Self-pay

## 2020-10-30 DIAGNOSIS — E782 Mixed hyperlipidemia: Secondary | ICD-10-CM

## 2020-10-30 MED ORDER — ATORVASTATIN CALCIUM 10 MG PO TABS: 10.0000 mg | ORAL_TABLET | Freq: Every day | ORAL | 0 refills | Status: DC

## 2020-10-30 NOTE — Telephone Encounter (Signed)
Medication sent.

## 2020-10-30 NOTE — Telephone Encounter (Signed)
Patient called to follow up on refill request sent by pharmacy for atorvastatin (LIPITOR) 10 MG tablet [[id number]]   Pharmacy confirmed as  [facility] [id number] [place] [address]  [place], [place] [address]  Phone:  [phone]  Fax:  [phone]    Patient only has 3-4 pills left.  Please advise at [phone].

## 2020-10-31 ENCOUNTER — Other Ambulatory Visit: Payer: Self-pay | Admitting: *Deleted

## 2020-10-31 DIAGNOSIS — E782 Mixed hyperlipidemia: Secondary | ICD-10-CM

## 2020-10-31 MED ORDER — ATORVASTATIN CALCIUM 10 MG PO TABS: 10.0000 mg | ORAL_TABLET | Freq: Every day | ORAL | 1 refills | Status: DC

## 2020-11-04 ENCOUNTER — Other Ambulatory Visit: Payer: Self-pay

## 2020-11-05 ENCOUNTER — Telehealth: Payer: Self-pay | Admitting: *Deleted

## 2020-11-05 NOTE — Telephone Encounter (Signed)
Patient due for Cologuard re-screen.   Order placed via [facility].   Cologuard (Order [id number])

## 2020-11-18 ENCOUNTER — Other Ambulatory Visit: Payer: Self-pay

## 2020-11-18 ENCOUNTER — Telehealth: Payer: Self-pay | Admitting: Nurse Practitioner

## 2020-11-18 MED ORDER — VERAPAMIL HCL ER 300 MG PO CP24: 1.0000 | ORAL_CAPSULE | Freq: Every day | ORAL | 3 refills | Status: DC

## 2020-11-18 MED ORDER — VERAPAMIL HCL ER 300 MG PO CP24: 1.0000 | ORAL_CAPSULE | Freq: Every day | ORAL | 2 refills | Status: DC

## 2020-11-18 NOTE — Telephone Encounter (Signed)
Medication updated to 90 day supply per insurance

## 2020-11-18 NOTE — Telephone Encounter (Signed)
Patient called to follow up on refill request for Verapamil HCl CR 300 MG CP24 [[id number]]   Pharmacy listed as  [facility] [id number]  [place], [place] [address]  Phone:  [phone]  Fax:  [phone]   Patient has 2 pills left (takes meds at night).   Please advise at [phone].

## 2020-11-18 NOTE — Telephone Encounter (Signed)
Patient called back to request her refill for Verapamil HCl CR 300 MG CP24 [[id number]]  is called in as a  90 day supply. Ppatient stated all of her prescriptions have to be for 90 days for her insurance company to pay. Please advise at [phone].

## 2021-02-03 NOTE — Progress Notes (Signed)
Subjective:    Patient ID: Leslie May, female    DOB: 15-May-1960, 60 y.o.   MRN: [id number]  HPI: Leslie May is a 60 y.o. female presenting for follow up.  No chief complaint on file.   ELEVATED BMI: 34 Tried Contrave back in February for weight loss, had trouble swallowing it and so she is no longer taking it.   She has been walking 1-2 miles daily.  Has been watching what she is eating.  Brings blood sugar log with her today and tells me she has 2 episodes of elevated blood sugar that has her worried. Duration: chronic Previous attempts at weight loss: yes Complications of obesity: prediabetes, high blood pressure Requesting obesity pharmacotherapy: no Current weight loss supplements/medications: none 24 hour diet recall:  Breakfast: boiled eggs, 1 piece of toast Lunch - salad: lettuce, tomatos, cucumber, light ranch dressing Supper - same as lunch Eats fruit in between meals for snacks  HYPERTENSION / HYPERLIPIDEMIA Currently taking atorvastatin 10 mg daily and omega-3 fish oil for high cholesterol.  Verapamil CR 300 mg and benazepril 10 mg daily for blood pressure.  She is wondering if cholesterol medication increased her blood sugar. BP monitoring frequency: daily BP range: 110s/70s Aspirin: no Recent stressors: no Recurrent headaches: no Visual changes: no Palpitations: no Dyspnea: no Chest pain: no Lower extremity edema: no Dizzy/lightheaded: no Myalgias: no The 10-year ASCVD risk score (Arnett DK, et al., 2019) is: 33.3%   Values used to calculate the score:     Age: 33 years     Sex: Female     Is Non-Hispanic African American: No     Diabetic: Yes     Tobacco smoker: Yes     Systolic Blood Pressure: 867 mmHg     Is BP treated: Yes     HDL Cholesterol: 30 mg/dL     Total Cholesterol: 189 mg/dL  PRE-DIABETES Hypoglycemic episodes:no Polydipsia/polyuria: no Visual disturbance: no Chest pain: no Paresthesias: no Glucose Monitoring:  yes  Accucheck frequency: daily in the morning  Fasting glucose: mostly low 110s, has had 160 and 180 reading Taking Insulin?: no  Also wants me to check her ears.  Says ever since she had COVID-19 last August, her left ear has felt plugged up.  She denies drainage, hearing loss, pain.  TOBACCO USER Smoking Status: current smoker Smoking Amount: 1.5 ppd Smoking Onset: ~40 years ago Smoking Quit Date: does not desire cessation Smoking triggers: stress, habit Type of tobacco use: cigarettes  Allergies  Allergen Reactions   Ezetimibe    Hctz [Hydrochlorothiazide] Itching   Norvasc [Amlodipine Besylate] Swelling    Throat swelling   Penicillins Swelling    Throat swelling    Outpatient Encounter Medications as of 02/04/2021  Medication Sig   aspirin 81 MG tablet Take 81 mg by mouth daily.   atorvastatin (LIPITOR) 10 MG tablet Take 1 tablet (10 mg total) by mouth daily.   cholecalciferol (VITAMIN D) 1000 units tablet Take 5,000 Units by mouth daily.    Cinnamon 500 MG TABS Take by mouth.   Cyanocobalamin (VITAMIN B-12) 5000 MCG TBDP Take by mouth.   Garlic 6195 MG CAPS Take by mouth.   Multiple Vitamin (MULTIVITAMIN WITH MINERALS) TABS tablet Take 1 tablet by mouth daily. Menopause Support formulation   olmesartan (BENICAR) 20 MG tablet Take 0.5 tablets (10 mg total) by mouth daily.   Omega-3 Fatty Acids (FISH OIL) 1000 MG CAPS (1) cap PO BID (Patient taking differently: Take  1 capsule by mouth 2 (two) times daily.)   omeprazole (PRILOSEC) 20 MG capsule TAKE ONE CAPSULE BY MOUTH DAILY   Verapamil HCl CR 300 MG CP24 Take 1 capsule (300 mg total) by mouth daily.   [DISCONTINUED] atorvastatin (LIPITOR) 10 MG tablet Take 1 tablet (10 mg total) by mouth daily.   [DISCONTINUED] Naltrexone-buPROPion HCl ER 8-90 MG TB12 Start 1 tablet every morning for 7 days, then 1 tablet twice daily   [DISCONTINUED] olmesartan (BENICAR) 20 MG tablet TAKE 1/2 TABLET BY MOUTH DAILY   [DISCONTINUED]  Verapamil HCl CR 300 MG CP24 Take 1 capsule (300 mg total) by mouth daily.   No facility-administered encounter medications on file as of 02/04/2021.    Patient Active Problem List   Diagnosis Date Noted   Vitamin D deficiency 02/04/2021   Anxiety and depression 11/29/2018   Diabetes mellitus ([place]) 07/07/2015   Hyperlipidemia 58/52/7782   Systolic murmur 42/35/3614   Post-menopausal bleeding 08/10/2011   Essential hypertension 08/10/2011   Severe obesity (BMI 35.0-35.9 with comorbidity) ([place]) 08/10/2011   Tobacco use 08/10/2011    Past Medical History:  Diagnosis Date   Cancer ([place]) > 20 years ago   cervical cancer s/p cryosurgery   Diabetes mellitus without complication ([place])    Hypertension    Routine general medical examination at a health care facility 10/22/2013    Relevant past medical, surgical, family and social history reviewed and updated as indicated. Interim medical history since our last visit reviewed.  Review of Systems Per HPI unless specifically indicated above     Objective:    BP (!) 160/88   Pulse 76   Temp 98.3 F (36.8 C) (Oral)   Ht 5\' 3"  (1.6 m)   Wt 196 lb 12.8 oz (89.3 kg)   SpO2 97%   BMI 34.86 kg/m   Wt Readings from Last 3 Encounters:  02/04/21 196 lb 12.8 oz (89.3 kg)  08/05/20 199 lb (90.3 kg)  07/02/20 201 lb (91.2 kg)    Physical Exam Vitals and nursing note reviewed.  Constitutional:      General: She is not in acute distress.    Appearance: Normal appearance. She is obese. She is not toxic-appearing.  HENT:     Right Ear: Tympanic membrane, ear canal and external ear normal. There is no impacted cerumen.     Left Ear: Tympanic membrane, ear canal and external ear normal. There is no impacted cerumen.  Eyes:     General: No scleral icterus.    Extraocular Movements: Extraocular movements intact.  Neck:     Vascular: No carotid bruit.  Cardiovascular:     Rate and Rhythm: Normal rate and regular rhythm.     Heart sounds:  Murmur heard.  Pulmonary:     Effort: Pulmonary effort is normal. No respiratory distress.     Breath sounds: Normal breath sounds. No wheezing, rhonchi or rales.  Musculoskeletal:        General: No swelling or tenderness.     Cervical back: Normal range of motion.     Right lower leg: No edema.  Skin:    General: Skin is warm and dry.     Capillary Refill: Capillary refill takes less than 2 seconds.     Coloration: Skin is not jaundiced.     Findings: No bruising.  Neurological:     Mental Status: She is alert and oriented to person, place, and time.     Gait: Gait normal.  Psychiatric:  Mood and Affect: Mood normal.        Behavior: Behavior normal.        Thought Content: Thought content normal.        Judgment: Judgment normal.      Assessment & Plan:   Problem List Items Addressed This Visit       Cardiovascular and Mediastinum   Essential hypertension - Primary    Chronic.  BP elevated above goal today in clinic, however at goal at home.  Will continue current medications including Verapamil 300 mg daily and Benicar 10 mg daily.  Check kidney function with electrolytes today.  Continue monitoring BP at home and notify us if readings consistently >130/80.  Follow up in 6 months.      Relevant Medications   atorvastatin (LIPITOR) 10 MG tablet   olmesartan (BENICAR) 20 MG tablet   Verapamil HCl CR 300 MG CP24   Other Relevant Orders   COMPLETE METABOLIC PANEL WITH GFR     Endocrine   Diabetes mellitus ([facility])    Last few A1c readings have been less than 6.0%.  She controls this with diet.  Will recheck today.  Educated about slightly elevated fasting blood sugar - not to be concerned unless consistent.  She had made great dietary and lifestyle changes and is active and I congratulated her on these things.  Follow up 6 montths.       Relevant Medications   atorvastatin (LIPITOR) 10 MG tablet   olmesartan (BENICAR) 20 MG tablet     Other   Vitamin D deficiency    Relevant Orders   VITAMIN D 25 Hydroxy (Vit-D Deficiency, Fractures)   Tobacco use    >40 years of smoking, she qualifies for lung cancer screening with low dose CT.  We discussed this today and she prefers to think about this for now and she will let us know if she wants to start this screening.       Systolic murmur    Echocardiogram ordered today.        Relevant Orders   ECHOCARDIOGRAM COMPLETE   Hyperlipidemia    Chronic.  Recheck lipids today - patient is fasting.  Continue atorvastatin 10 mg daily for now.  Follow up in 6 months.       Relevant Medications   atorvastatin (LIPITOR) 10 MG tablet   olmesartan (BENICAR) 20 MG tablet   Verapamil HCl CR 300 MG CP24   Other Relevant Orders   Lipid panel   Other Visit Diagnoses     Pressure sensation in left ear       Examination unremarkable.  Discussed use of intranasal corticosteroid to help with possible eustachian tube dysfunction.        Follow up plan: Return in about 6 months (around 08/04/2021) for follow up.

## 2021-02-04 ENCOUNTER — Ambulatory Visit: Payer: Managed Care, Other (non HMO) | Admitting: Nurse Practitioner

## 2021-02-04 ENCOUNTER — Other Ambulatory Visit: Payer: Self-pay

## 2021-02-04 ENCOUNTER — Encounter: Payer: Self-pay | Admitting: Nurse Practitioner

## 2021-02-04 VITALS — BP 160/88 | HR 76 | Temp 98.3°F | Ht 63.0 in | Wt 196.8 lb

## 2021-02-04 DIAGNOSIS — E782 Mixed hyperlipidemia: Secondary | ICD-10-CM

## 2021-02-04 DIAGNOSIS — I10 Essential (primary) hypertension: Secondary | ICD-10-CM | POA: Diagnosis not present

## 2021-02-04 DIAGNOSIS — E559 Vitamin D deficiency, unspecified: Secondary | ICD-10-CM | POA: Diagnosis not present

## 2021-02-04 DIAGNOSIS — R7303 Prediabetes: Secondary | ICD-10-CM | POA: Diagnosis not present

## 2021-02-04 DIAGNOSIS — Z720 Tobacco use: Secondary | ICD-10-CM

## 2021-02-04 DIAGNOSIS — H938X2 Other specified disorders of left ear: Secondary | ICD-10-CM

## 2021-02-04 DIAGNOSIS — R011 Cardiac murmur, unspecified: Secondary | ICD-10-CM

## 2021-02-04 MED ORDER — VERAPAMIL HCL ER 300 MG PO CP24: 1.0000 | ORAL_CAPSULE | Freq: Every day | ORAL | 1 refills | Status: AC

## 2021-02-04 MED ORDER — ATORVASTATIN CALCIUM 10 MG PO TABS: 10.0000 mg | ORAL_TABLET | Freq: Every day | ORAL | 1 refills | Status: DC

## 2021-02-04 MED ORDER — OLMESARTAN MEDOXOMIL 20 MG PO TABS: 10.0000 mg | ORAL_TABLET | Freq: Every day | ORAL | 1 refills | Status: AC

## 2021-02-04 NOTE — Assessment & Plan Note (Signed)
>  40 years of smoking, she qualifies for lung cancer screening with low dose CT.  We discussed this today and she prefers to think about this for now and she will let us know if she wants to start this screening.

## 2021-02-04 NOTE — Assessment & Plan Note (Signed)
Chronic.  Recheck lipids today - patient is fasting.  Continue atorvastatin 10 mg daily for now.  Follow up in 6 months.

## 2021-02-04 NOTE — Assessment & Plan Note (Signed)
Chronic.  BP elevated above goal today in clinic, however at goal at home.  Will continue current medications including Verapamil 300 mg daily and Benicar 10 mg daily.  Check kidney function with electrolytes today.  Continue monitoring BP at home and notify us if readings consistently >130/80.  Follow up in 6 months.

## 2021-02-04 NOTE — Assessment & Plan Note (Signed)
Echocardiogram ordered today.

## 2021-02-04 NOTE — Assessment & Plan Note (Signed)
Last few A1c readings have been less than 6.0%.  She controls this with diet.  Will recheck today.  Educated about slightly elevated fasting blood sugar - not to be concerned unless consistent.  She had made great dietary and lifestyle changes and is active and I congratulated her on these things.  Follow up 6 montths.

## 2021-02-05 LAB — COMPLETE METABOLIC PANEL WITH GFR
AG Ratio: 1.5 (calc) (ref 1.0–2.5)
ALT: 15 U/L (ref 6–29)
AST: 14 U/L (ref 10–35)
Albumin: 4.3 g/dL (ref 3.6–5.1)
Alkaline phosphatase (APISO): 69 U/L (ref 37–153)
BUN: 19 mg/dL (ref 7–25)
CO2: 25 mmol/L (ref 20–32)
Calcium: 10.2 mg/dL (ref 8.6–10.4)
Chloride: 104 mmol/L (ref 98–110)
Creat: 0.7 mg/dL (ref 0.50–1.03)
Globulin: 2.9 g/dL (calc) (ref 1.9–3.7)
Glucose, Bld: 87 mg/dL (ref 65–99)
Potassium: 4.3 mmol/L (ref 3.5–5.3)
Sodium: 140 mmol/L (ref 135–146)
Total Bilirubin: 0.6 mg/dL (ref 0.2–1.2)
Total Protein: 7.2 g/dL (ref 6.1–8.1)
eGFR: 100 mL/min/{1.73_m2} (ref >=60)

## 2021-02-05 LAB — HEMOGLOBIN A1C
Hgb A1c MFr Bld: 5.5 % of total Hgb (ref <5.7)
Mean Plasma Glucose: 111 mg/dL
eAG (mmol/L): 6.2 mmol/L

## 2021-02-05 LAB — LIPID PANEL
Cholesterol: 126 mg/dL (ref <200)
HDL: 29 mg/dL — ABNORMAL LOW (ref >=50)
LDL Cholesterol (Calc): 70 mg/dL (calc)
Non-HDL Cholesterol (Calc): 97 mg/dL (calc) (ref <130)
Total CHOL/HDL Ratio: 4.3 (calc) (ref <5.0)
Triglycerides: 200 mg/dL — ABNORMAL HIGH (ref <150)

## 2021-02-05 LAB — VITAMIN D 25 HYDROXY (VIT D DEFICIENCY, FRACTURES): Vit D, 25-Hydroxy: 57 ng/mL (ref 30–100)

## 2021-02-14 LAB — COLOGUARD: Cologuard: NEGATIVE

## 2021-02-19 NOTE — Telephone Encounter (Signed)
Received the results of Cologuard screening.   Screening noted negative.   A negative result indicates a low likelihood of colorectal cancer is present. Following a negative Cologuard result, the [employer] recommends a Cologuard re-screening interval of 3 years.   Letter sent.   

## 2021-08-05 ENCOUNTER — Other Ambulatory Visit: Payer: Self-pay | Admitting: Nurse Practitioner

## 2021-08-05 DIAGNOSIS — E782 Mixed hyperlipidemia: Secondary | ICD-10-CM

## 2021-11-03 ENCOUNTER — Other Ambulatory Visit: Payer: Self-pay | Admitting: Nurse Practitioner

## 2021-11-03 DIAGNOSIS — I10 Essential (primary) hypertension: Secondary | ICD-10-CM
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
## 2024-05-31 ENCOUNTER — Other Ambulatory Visit (HOSPITAL_COMMUNITY): Payer: Self-pay | Admitting: Family Medicine

## 2024-05-31 DIAGNOSIS — F17200 Nicotine dependence, unspecified, uncomplicated: Secondary | ICD-10-CM

## 2024-06-11 ENCOUNTER — Encounter (HOSPITAL_COMMUNITY): Payer: Self-pay

## 2024-06-11 ENCOUNTER — Ambulatory Visit (HOSPITAL_COMMUNITY): Admission: RE | Admit: 2024-06-11 | Source: Ambulatory Visit

## 2024-07-02 ENCOUNTER — Ambulatory Visit (HOSPITAL_COMMUNITY)
